# Patient Record
Sex: Female | Born: 1980 | Race: White | Hispanic: No | Marital: Single | Smoking: Current every day smoker
Health system: Southern US, Community
[De-identification: ages and names within clinical notes are randomized; demographics above are authoritative.]

## PROBLEM LIST (undated history)

## (undated) DIAGNOSIS — B1920 Unspecified viral hepatitis C without hepatic coma: Secondary | ICD-10-CM

## (undated) HISTORY — PX: HERNIA REPAIR: SHX51

---

## 2013-05-08 ENCOUNTER — Emergency Department (HOSPITAL_COMMUNITY)
Admission: EM | Admit: 2013-05-08 | Discharge: 2013-05-08 | Disposition: A | Discharge status: Home / Self Care | Payer: Self-pay | Attending: Emergency Medicine | Admitting: Emergency Medicine

## 2013-05-08 ENCOUNTER — Emergency Department (HOSPITAL_COMMUNITY): Payer: Self-pay

## 2013-05-08 ENCOUNTER — Encounter (HOSPITAL_COMMUNITY): Payer: Self-pay | Admitting: Emergency Medicine

## 2013-05-08 DIAGNOSIS — R630 Anorexia: Secondary | ICD-10-CM | POA: Insufficient documentation

## 2013-05-08 DIAGNOSIS — R197 Diarrhea, unspecified: Secondary | ICD-10-CM | POA: Insufficient documentation

## 2013-05-08 DIAGNOSIS — K5289 Other specified noninfective gastroenteritis and colitis: Secondary | ICD-10-CM | POA: Insufficient documentation

## 2013-05-08 DIAGNOSIS — N390 Urinary tract infection, site not specified: Secondary | ICD-10-CM

## 2013-05-08 DIAGNOSIS — Z3202 Encounter for pregnancy test, result negative: Secondary | ICD-10-CM | POA: Insufficient documentation

## 2013-05-08 DIAGNOSIS — K529 Noninfective gastroenteritis and colitis, unspecified: Secondary | ICD-10-CM

## 2013-05-08 DIAGNOSIS — F17200 Nicotine dependence, unspecified, uncomplicated: Secondary | ICD-10-CM | POA: Insufficient documentation

## 2013-05-08 DIAGNOSIS — R1110 Vomiting, unspecified: Secondary | ICD-10-CM | POA: Insufficient documentation

## 2013-05-08 DIAGNOSIS — B1920 Unspecified viral hepatitis C without hepatic coma: Secondary | ICD-10-CM | POA: Insufficient documentation

## 2013-05-08 HISTORY — DX: Unspecified viral hepatitis C without hepatic coma: B19.20

## 2013-05-08 LAB — COMPREHENSIVE METABOLIC PANEL
AST: 18 U/L (ref 0–37)
Albumin: 3.7 g/dL (ref 3.5–5.2)
Alkaline Phosphatase: 80 U/L (ref 39–117)
BUN: 13 mg/dL (ref 6–23)
Calcium: 9.3 mg/dL (ref 8.4–10.5)
Creatinine, Ser: 1.04 mg/dL (ref 0.50–1.10)
GFR calc Af Amer: 82 mL/min — ABNORMAL LOW (ref >90)
Glucose, Bld: 113 mg/dL — ABNORMAL HIGH (ref 70–99)
Total Bilirubin: 0.3 mg/dL (ref 0.3–1.2)
Total Protein: 7.1 g/dL (ref 6.0–8.3)

## 2013-05-08 LAB — URINE MICROSCOPIC-ADD ON

## 2013-05-08 LAB — CBC WITH DIFFERENTIAL/PLATELET
Basophils Relative: 1 % (ref 0–1)
Eosinophils Absolute: 0.2 10*3/uL (ref 0.0–0.7)
Eosinophils Relative: 3 % (ref 0–5)
HCT: 38.3 % (ref 36.0–46.0)
Hemoglobin: 12.9 g/dL (ref 12.0–15.0)
Lymphs Abs: 2.5 10*3/uL (ref 0.7–4.0)
MCH: 31.2 pg (ref 26.0–34.0)
MCHC: 33.7 g/dL (ref 30.0–36.0)
MCV: 92.5 fL (ref 78.0–100.0)
Monocytes Absolute: 0.5 10*3/uL (ref 0.1–1.0)
Monocytes Relative: 7 % (ref 3–12)
Neutro Abs: 3.6 10*3/uL (ref 1.7–7.7)
Neutrophils Relative %: 53 % (ref 43–77)
RBC: 4.14 MIL/uL (ref 3.87–5.11)
WBC: 6.8 10*3/uL (ref 4.0–10.5)

## 2013-05-08 LAB — POCT PREGNANCY, URINE: Preg Test, Ur: NEGATIVE

## 2013-05-08 LAB — URINALYSIS, ROUTINE W REFLEX MICROSCOPIC
Glucose, UA: NEGATIVE mg/dL
Ketones, ur: NEGATIVE mg/dL
Nitrite: POSITIVE — AB
Protein, ur: NEGATIVE mg/dL
Specific Gravity, Urine: 1.024 (ref 1.005–1.030)
pH: 7 (ref 5.0–8.0)

## 2013-05-08 MED ORDER — IOHEXOL 300 MG/ML  SOLN
80.0000 mL | Freq: Once | INTRAMUSCULAR | Status: AC | PRN
  Administered 2013-05-08: 80 mL via INTRAVENOUS

## 2013-05-08 MED ORDER — HYDROMORPHONE HCL PF 1 MG/ML IJ SOLN
1.0000 mg | Freq: Once | INTRAMUSCULAR | Status: AC
  Administered 2013-05-08: 1 mg via INTRAVENOUS
  Filled 2013-05-08: qty 1

## 2013-05-08 MED ORDER — DEXTROSE 5 % IV SOLN
1.0000 g | Freq: Once | INTRAVENOUS | Status: AC
  Administered 2013-05-08: 1 g via INTRAVENOUS
  Filled 2013-05-08: qty 10

## 2013-05-08 MED ORDER — ONDANSETRON HCL 4 MG PO TABS: 4.0000 mg | ORAL_TABLET | Freq: Four times a day (QID) | ORAL | Status: DC

## 2013-05-08 MED ORDER — IOHEXOL 300 MG/ML  SOLN
25.0000 mL | Freq: Once | INTRAMUSCULAR | Status: AC | PRN
  Administered 2013-05-08: 25 mL via ORAL

## 2013-05-08 MED ORDER — ONDANSETRON HCL 4 MG/2ML IJ SOLN
4.0000 mg | Freq: Once | INTRAMUSCULAR | Status: AC
  Administered 2013-05-08: 4 mg via INTRAVENOUS
  Filled 2013-05-08: qty 2

## 2013-05-08 MED ORDER — CIPROFLOXACIN HCL 250 MG PO TABS: 250.0000 mg | ORAL_TABLET | Freq: Two times a day (BID) | ORAL | Status: AC

## 2013-05-08 NOTE — ED Notes (Signed)
Pt reports n/v since last night. Only pain is menstrual cramps she states that's normal for her because shes on her period. 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

## 2013-05-08 NOTE — ED Notes (Signed)
The pt reports that her pain is better but her rlq is  The worst pain..  She is asking for oral fluid/  Not given.  Unsure id she needs further testing.

## 2013-05-08 NOTE — ED Notes (Signed)
Iv nss started

## 2013-05-08 NOTE — ED Notes (Signed)
The pt returned from c-t 

## 2013-05-08 NOTE — ED Notes (Signed)
Pain and nausea med given.  Pt asking for her rt ear to be checked by the doctor

## 2013-05-08 NOTE — ED Notes (Signed)
The pt has had nv and diarrhea since this am.  She also has had abd cramps since yesterday.  She is currently on her period.  No obvious distress at present skin warm and dry

## 2013-05-08 NOTE — ED Provider Notes (Signed)
CSN: [id number]     Arrival date & time 05/08/13  1527 History   First MD Initiated Contact with Patient 05/08/13 1629     Chief Complaint  Patient presents with  . Emesis   (Consider location/radiation/quality/duration/timing/severity/associated sxs/prior Treatment) HPI Comments: 32 y/o female with history of hepatitis C presenting with emesis and abdominal pain. RLQ pain began 3 days ago and is intermittent and aching. Emesis began last night with 6 episodes of nonbloody, nonbilious emesis. Associated with nonbloody diarrhea. No fevers, dysuria, vaginal discharge. Reports tubal ligation. Reports currently on menstrual cycle which began yesterday and has mild abdominal cramping.  The history is provided by the patient. No language interpreter was used.    Past Medical History  Diagnosis Date  . Hepatitis C    History reviewed. No pertinent past surgical history. History reviewed. No pertinent family history. History  Substance Use Topics  . Smoking status: Current Every Day Smoker    Types: Cigarettes  . Smokeless tobacco: Not on file  . Alcohol Use: Yes   OB History   Grav Para Term Preterm Abortions TAB SAB Ect Mult Living                 Review of Systems  Constitutional: Positive for appetite change. Negative for fever and activity change.  Respiratory: Negative for cough, chest tightness and shortness of breath.   Cardiovascular: Negative for chest pain.  Gastrointestinal: Positive for nausea, vomiting, abdominal pain and diarrhea. Negative for constipation, blood in stool and abdominal distention.  Genitourinary: Positive for vaginal bleeding. Negative for dysuria, frequency, hematuria, vaginal discharge and pelvic pain.  Musculoskeletal: Negative for back pain.  All other systems reviewed and are negative.    Allergies  Ultram and Z-pak  Home Medications   Current Outpatient Rx  Name  Route  Sig  Dispense  Refill  . acetaminophen (TYLENOL) 500 MG tablet  Oral   Take 500 mg by mouth every 6 (six) hours as needed for pain.          BP 109/68  Pulse 62  Temp(Src) 99.1 F (37.3 C) (Oral)  Resp 20  SpO2 99% Physical Exam  Vitals reviewed. Constitutional: She is oriented to person, place, and time. She appears well-developed and well-nourished. No distress.  HENT:  Head: Atraumatic.  Mouth/Throat: Oropharynx is clear and moist.  Eyes: Conjunctivae are normal.  Cardiovascular: Normal rate, regular rhythm, normal heart sounds and intact distal pulses.   Pulmonary/Chest: Effort normal and breath sounds normal.  Abdominal: Soft. Bowel sounds are normal. There is no hepatosplenomegaly. There is tenderness in the right lower quadrant. There is no rigidity, no rebound, no guarding and no CVA tenderness. No hernia. Hernia confirmed negative in the right inguinal area and confirmed negative in the left inguinal area.  No peritonitis. Negative Rovsing's   Genitourinary: Uterus normal. There is no rash or lesion on the right labia. There is no rash or lesion on the left labia. Cervix exhibits no motion tenderness. Right adnexum displays no mass, no tenderness and no fullness. Left adnexum displays no mass, no tenderness and no fullness.  Lymphadenopathy:       Right: No inguinal adenopathy present.       Left: No inguinal adenopathy present.  Neurological: She is alert and oriented to person, place, and time.  Skin: Skin is warm and dry.  Psychiatric: She has a normal mood and affect. Her behavior is normal. Judgment and thought content normal.    ED Course  Procedures (including critical care time) Labs Review Labs Reviewed  COMPREHENSIVE METABOLIC PANEL - Abnormal; Notable for the following:    Glucose, Bld 113 (*)    ALT 46 (*)    GFR calc non Af Amer 71 (*)    GFR calc Af Amer 82 (*)    All other components within normal limits  URINALYSIS, ROUTINE W REFLEX MICROSCOPIC - Abnormal; Notable for the following:    APPearance CLOUDY (*)     Hgb urine dipstick TRACE (*)    Nitrite POSITIVE (*)    Leukocytes, UA SMALL (*)    All other components within normal limits  URINE MICROSCOPIC-ADD ON - Abnormal; Notable for the following:    Squamous Epithelial / LPF FEW (*)    Bacteria, UA MANY (*)    All other components within normal limits  URINE CULTURE  CBC WITH DIFFERENTIAL  POCT PREGNANCY, URINE   Imaging Review Ct Abdomen Pelvis W Contrast  05/08/2013   CLINICAL DATA:  32 year old female with right abdominal and pelvic pain, diarrhea and vomiting.  EXAM: CT ABDOMEN AND PELVIS WITH CONTRAST  TECHNIQUE: Multidetector CT imaging of the abdomen and pelvis was performed using the standard protocol following bolus administration of intravenous contrast.  CONTRAST:  80mL OMNIPAQUE IOHEXOL 300 MG/ML  SOLN  COMPARISON:  None  FINDINGS: The liver, spleen, kidneys, adrenal glands, gallbladder and pancreas are unremarkable.  Duodenal and proximal jejunum wall thickening noted - question enteritis.  There is no evidence of bowel obstruction, pneumoperitoneum or abscess.  The appendix and terminal ileum are unremarkable.  There is no evidence of free fluid, enlarged lymph nodes, biliary dilation or abdominal aortic aneurysm.  The uterus and adnexal regions are within normal limits.  No acute or suspicious bony abnormalities are identified.  IMPRESSION: Nonspecific duodenal and proximal jejunal wall thickening - most likely representing an enteritis. Clinical followup recommended.   Electronically Signed   By: Laveda Abbe M.D.   On: 05/08/2013 19:15    EKG Interpretation   None       MDM   1. Enteritis   2. UTI (lower urinary tract infection)     32 y/o female with RLQ abdominal pain and NBNB emesis. Associated with nonbloody diarrhea. No fevers. Unable to tolerate PO for 1 day. Pain began 3 days ago. AFVSS. Mild RLQ tenderness on exam. No peritonitis. Bimanual exam unremarkable, no adnexal tenderness, no CMT. Doubt TOA, PID, torsion.  UA  with  Nitrites, leukocytes, and bacteria. UPT negative. CT obtained to r/o appendicitis and showing normal appendix and enteritis. Will treat for UTI with Ciprofloxacin. Zofran given for nausea. Patient tolerating PO intake. Appropriate for discharge with PCP f/u. Return precautions discussed and patient voiced understanding of instructions.   Labs and imaging reviewed in my medical decision making if ordered. Patient discussed with my attending, Dr. Bluford Kaufmann, MD 05/09/13 [phone]

## 2013-05-08 NOTE — ED Notes (Signed)
Oral contrast  Given.   1 cup to drink

## 2013-05-08 NOTE — ED Provider Notes (Signed)
I have supervised the resident on the management of this patient and agree with the note above. I personally interviewed and examined the patient and my addendum is below.   Rebecca Neal is a 32 y.o. female hx of hepatitis C here with RLQ pain. Pain for the last 3 days, + vomiting since yesterday. She is on her menstrual cycle currently. Mild tenderness RLQ. Nontender on the R adnexa. Will do CT ab/pel to r/o appy.   CT showed enteritis. She also has UTI, given cipro. Stable for d/c.    Richardean Canal, MD 05/09/13 [phone]

## 2013-05-11 LAB — URINE CULTURE: Colony Count: >=100000

## 2013-05-12 NOTE — Progress Notes (Signed)
ED Antimicrobial Stewardship Positive Culture Follow Up   Rebecca Neal is an 32 y.o. female who presented to [facility] on 05/08/2013 with a chief complaint of  Chief Complaint  Patient presents with  . Emesis    Recent Results (from the past 720 hour(s))  URINE CULTURE     Status: None   Collection Time    05/08/13  4:31 PM      Result Value Range Status   Specimen Description URINE, RANDOM   Final   Special Requests NONE   Final   Culture  Setup Time     Final   Value: 05/09/2013 02:01     Performed at [facility] Count     Final   Value: >=100,000 COLONIES/ML     Performed at [facility]   Culture     Final   Value: ESCHERICHIA COLI     Performed at [facility]   Report Status 05/11/2013 FINAL   Final   Organism ID, Bacteria ESCHERICHIA COLI   Final    [x]  Treated with Ciprofloxacin, organism resistant to prescribed antimicrobial []  Patient discharged originally without antimicrobial agent and treatment is now indicated  New antibiotic prescription: Bactrim DS 1 tablet PO BID x 5 days  ED Provider: Ebbie Ridge, PA-C   Cleon Dew 05/12/2013, 4:11 PM Infectious Diseases Pharmacist Phone# [phone]

## 2013-05-12 NOTE — ED Notes (Signed)
Post ED Visit - Positive Culture Follow-up: Successful Patient Follow-Up  Culture assessed and recommendations reviewed by: [x]  Wes Dulaney, Pharm.D., BCPS []  Celedonio Miyamoto, Pharm.D., BCPS []  Georgina Pillion, Pharm.D., BCPS []  Lenora, [address].D., BCPS, AAHIVP []  Estella Husk, Pharm.D., BCPS, AAHIVP  Positive urine culture  []  Patient discharged without antimicrobial prescription and treatment is now indicated [x]  Organism is resistant to prescribed ED discharge antimicrobial []  Patient with positive blood cultures  Changes discussed with ED provider: Ebbie Ridge New antibiotic prescription Stop Cipro start Bactrim DS 1 tablet po BID x 5 days   Patient informed of positive results   Larena Sox 05/12/2013, 4:24 PM

## 2014-01-30 ENCOUNTER — Encounter (HOSPITAL_COMMUNITY): Payer: Self-pay | Admitting: *Deleted

## 2014-01-30 ENCOUNTER — Inpatient Hospital Stay (HOSPITAL_COMMUNITY)
Admission: AD | Admit: 2014-01-30 | Discharge: 2014-02-04 | DRG: 885 | Disposition: A | Discharge status: Home / Self Care | Payer: No Typology Code available for payment source | Source: Intra-hospital | Attending: Psychiatry | Admitting: Psychiatry

## 2014-01-30 ENCOUNTER — Emergency Department (HOSPITAL_COMMUNITY)
Admission: EM | Admit: 2014-01-30 | Discharge: 2014-01-30 | Disposition: A | Discharge status: Other Acute Inpatient Hospital | Payer: Self-pay | Attending: Emergency Medicine | Admitting: Emergency Medicine

## 2014-01-30 ENCOUNTER — Encounter (HOSPITAL_COMMUNITY): Payer: Self-pay | Admitting: Emergency Medicine

## 2014-01-30 DIAGNOSIS — F17200 Nicotine dependence, unspecified, uncomplicated: Secondary | ICD-10-CM | POA: Diagnosis present

## 2014-01-30 DIAGNOSIS — F332 Major depressive disorder, recurrent severe without psychotic features: Principal | ICD-10-CM | POA: Diagnosis present

## 2014-01-30 DIAGNOSIS — F1910 Other psychoactive substance abuse, uncomplicated: Secondary | ICD-10-CM

## 2014-01-30 DIAGNOSIS — F329 Major depressive disorder, single episode, unspecified: Secondary | ICD-10-CM | POA: Diagnosis present

## 2014-01-30 DIAGNOSIS — Z3202 Encounter for pregnancy test, result negative: Secondary | ICD-10-CM | POA: Insufficient documentation

## 2014-01-30 DIAGNOSIS — Z598 Other problems related to housing and economic circumstances: Secondary | ICD-10-CM

## 2014-01-30 DIAGNOSIS — F4310 Post-traumatic stress disorder, unspecified: Secondary | ICD-10-CM | POA: Diagnosis present

## 2014-01-30 DIAGNOSIS — F411 Generalized anxiety disorder: Secondary | ICD-10-CM | POA: Insufficient documentation

## 2014-01-30 DIAGNOSIS — F3289 Other specified depressive episodes: Secondary | ICD-10-CM | POA: Insufficient documentation

## 2014-01-30 DIAGNOSIS — F1110 Opioid abuse, uncomplicated: Secondary | ICD-10-CM | POA: Insufficient documentation

## 2014-01-30 DIAGNOSIS — B1920 Unspecified viral hepatitis C without hepatic coma: Secondary | ICD-10-CM | POA: Diagnosis present

## 2014-01-30 DIAGNOSIS — F603 Borderline personality disorder: Secondary | ICD-10-CM | POA: Diagnosis present

## 2014-01-30 DIAGNOSIS — N390 Urinary tract infection, site not specified: Secondary | ICD-10-CM | POA: Insufficient documentation

## 2014-01-30 DIAGNOSIS — Z5987 Material hardship due to limited financial resources, not elsewhere classified: Secondary | ICD-10-CM

## 2014-01-30 DIAGNOSIS — F39 Unspecified mood [affective] disorder: Secondary | ICD-10-CM | POA: Diagnosis present

## 2014-01-30 DIAGNOSIS — F1220 Cannabis dependence, uncomplicated: Secondary | ICD-10-CM | POA: Insufficient documentation

## 2014-01-30 DIAGNOSIS — F1210 Cannabis abuse, uncomplicated: Secondary | ICD-10-CM | POA: Diagnosis present

## 2014-01-30 DIAGNOSIS — F333 Major depressive disorder, recurrent, severe with psychotic symptoms: Secondary | ICD-10-CM

## 2014-01-30 DIAGNOSIS — G4700 Insomnia, unspecified: Secondary | ICD-10-CM | POA: Diagnosis present

## 2014-01-30 DIAGNOSIS — R45851 Suicidal ideations: Secondary | ICD-10-CM

## 2014-01-30 DIAGNOSIS — Z8619 Personal history of other infectious and parasitic diseases: Secondary | ICD-10-CM | POA: Insufficient documentation

## 2014-01-30 LAB — COMPREHENSIVE METABOLIC PANEL
ALT: 141 U/L — ABNORMAL HIGH (ref 0–35)
AST: 127 U/L — ABNORMAL HIGH (ref 0–37)
Albumin: 3.9 g/dL (ref 3.5–5.2)
Alkaline Phosphatase: 128 U/L — ABNORMAL HIGH (ref 39–117)
Anion gap: 12 (ref 5–15)
BUN: 9 mg/dL (ref 6–23)
CALCIUM: 9.1 mg/dL (ref 8.4–10.5)
CO2: 24 mEq/L (ref 19–32)
Chloride: 104 mEq/L (ref 96–112)
Creatinine, Ser: 0.55 mg/dL (ref 0.50–1.10)
GLUCOSE: 112 mg/dL — AB (ref 70–99)
Potassium: 4 mEq/L (ref 3.7–5.3)
Sodium: 140 mEq/L (ref 137–147)
Total Bilirubin: 0.3 mg/dL (ref 0.3–1.2)
Total Protein: 7.1 g/dL (ref 6.0–8.3)

## 2014-01-30 LAB — URINALYSIS, ROUTINE W REFLEX MICROSCOPIC
Bilirubin Urine: NEGATIVE
Glucose, UA: NEGATIVE mg/dL
Ketones, ur: NEGATIVE mg/dL
Nitrite: POSITIVE — AB
Protein, ur: NEGATIVE mg/dL
Specific Gravity, Urine: 1.013 (ref 1.005–1.030)
Urobilinogen, UA: 1 mg/dL (ref 0.0–1.0)
pH: 7.5 (ref 5.0–8.0)

## 2014-01-30 LAB — CBC WITH DIFFERENTIAL/PLATELET
Basophils Absolute: 0 10*3/uL (ref 0.0–0.1)
Basophils Relative: 1 % (ref 0–1)
EOS PCT: 2 % (ref 0–5)
Eosinophils Absolute: 0.1 10*3/uL (ref 0.0–0.7)
HEMATOCRIT: 38.7 % (ref 36.0–46.0)
HEMOGLOBIN: 13 g/dL (ref 12.0–15.0)
Lymphocytes Relative: 45 % (ref 12–46)
Lymphs Abs: 2 10*3/uL (ref 0.7–4.0)
MCH: 30.5 pg (ref 26.0–34.0)
MCHC: 33.6 g/dL (ref 30.0–36.0)
MCV: 90.8 fL (ref 78.0–100.0)
MONO ABS: 0.3 10*3/uL (ref 0.1–1.0)
MONOS PCT: 7 % (ref 3–12)
Neutro Abs: 2 10*3/uL (ref 1.7–7.7)
Neutrophils Relative %: 45 % (ref 43–77)
Platelets: 193 10*3/uL (ref 150–400)
RBC: 4.26 MIL/uL (ref 3.87–5.11)
RDW: 13.1 % (ref 11.5–15.5)
WBC: 4.5 10*3/uL (ref 4.0–10.5)

## 2014-01-30 LAB — RAPID URINE DRUG SCREEN, HOSP PERFORMED
Amphetamines: NOT DETECTED
Barbiturates: NOT DETECTED
Benzodiazepines: NOT DETECTED
COCAINE: NOT DETECTED
Opiates: POSITIVE — AB
Tetrahydrocannabinol: POSITIVE — AB

## 2014-01-30 LAB — URINE MICROSCOPIC-ADD ON

## 2014-01-30 LAB — SALICYLATE LEVEL: Salicylate Lvl: <2 mg/dL — ABNORMAL LOW (ref 2.8–20.0)

## 2014-01-30 LAB — ETHANOL: Alcohol, Ethyl (B): <11 mg/dL (ref 0–11)

## 2014-01-30 LAB — PREGNANCY, URINE: Preg Test, Ur: NEGATIVE

## 2014-01-30 LAB — ACETAMINOPHEN LEVEL: Acetaminophen (Tylenol), Serum: <15 ug/mL (ref 10–30)

## 2014-01-30 MED ORDER — NICOTINE 21 MG/24HR TD PT24
21.0000 mg | MEDICATED_PATCH | Freq: Every day | TRANSDERMAL | Status: DC
  Administered 2014-01-30 – 2014-02-03 (×5): 21 mg via TRANSDERMAL
  Filled 2014-01-30: qty 1
  Filled 2014-01-30: qty 14
  Filled 2014-01-30 (×5): qty 1

## 2014-01-30 MED ORDER — MAGNESIUM HYDROXIDE 400 MG/5ML PO SUSP: 30.0000 mL | Freq: Every day | ORAL | Status: DC | PRN

## 2014-01-30 MED ORDER — ALUM & MAG HYDROXIDE-SIMETH 200-200-20 MG/5ML PO SUSP: 30.0000 mL | ORAL | Status: DC | PRN

## 2014-01-30 MED ORDER — IBUPROFEN 400 MG PO TABS: 600.0000 mg | ORAL_TABLET | Freq: Three times a day (TID) | ORAL | Status: DC | PRN

## 2014-01-30 MED ORDER — NICOTINE 21 MG/24HR TD PT24: 21.0000 mg | MEDICATED_PATCH | Freq: Every day | TRANSDERMAL | Status: DC

## 2014-01-30 MED ORDER — ONDANSETRON HCL 4 MG PO TABS: 4.0000 mg | ORAL_TABLET | Freq: Three times a day (TID) | ORAL | Status: DC | PRN

## 2014-01-30 MED ORDER — ACETAMINOPHEN 325 MG PO TABS: 650.0000 mg | ORAL_TABLET | ORAL | Status: DC | PRN

## 2014-01-30 MED ORDER — TRAZODONE HCL 50 MG PO TABS
50.0000 mg | ORAL_TABLET | Freq: Every evening | ORAL | Status: DC | PRN
  Administered 2014-02-02: 50 mg via ORAL
  Filled 2014-01-30: qty 1

## 2014-01-30 MED ORDER — CEPHALEXIN 250 MG PO CAPS
1000.0000 mg | ORAL_CAPSULE | Freq: Once | ORAL | Status: AC
  Administered 2014-01-30: 1000 mg via ORAL
  Filled 2014-01-30: qty 4

## 2014-01-30 MED ORDER — ZOLPIDEM TARTRATE 5 MG PO TABS: 5.0000 mg | ORAL_TABLET | Freq: Every evening | ORAL | Status: DC | PRN

## 2014-01-30 MED ORDER — NICOTINE 21 MG/24HR TD PT24
MEDICATED_PATCH | TRANSDERMAL | Status: AC
Start: 2014-01-30 — End: 2014-01-31
  Filled 2014-01-30: qty 1

## 2014-01-30 MED ORDER — ACETAMINOPHEN 325 MG PO TABS
650.0000 mg | ORAL_TABLET | Freq: Four times a day (QID) | ORAL | Status: DC | PRN
  Administered 2014-02-02: 650 mg via ORAL
  Filled 2014-01-30: qty 2

## 2014-01-30 MED ORDER — HYDROXYZINE HCL 50 MG PO TABS
50.0000 mg | ORAL_TABLET | Freq: Four times a day (QID) | ORAL | Status: DC | PRN
  Administered 2014-01-30 – 2014-02-04 (×4): 50 mg via ORAL
  Filled 2014-01-30 (×4): qty 1

## 2014-01-30 NOTE — ED Provider Notes (Signed)
CSN: [id number]     Arrival date & time 01/30/14  0628 History   First MD Initiated Contact with Patient 01/30/14 [phone]     Chief Complaint  Patient presents with  . Suicidal  . Depression     (Consider location/radiation/quality/duration/timing/severity/associated sxs/prior Treatment) HPI Patient presents to the emergency department with suicidal ideation, and increased depression.  The patient, states, that she's attempted suicide in the past by running out in front of a car and taking pills.  The patient, states, that she does not have any nausea, vomiting, headache, blurred vision, weakness, dizziness, back pain, neck pain, chest pain, shortness of breath, or syncope.  The patient, states, that she does hear voices, but she does not have any harmful.  Voices Past Medical History  Diagnosis Date  . Hepatitis C    History reviewed. No pertinent past surgical history. No family history on file. History  Substance Use Topics  . Smoking status: Current Every Day Smoker    Types: Cigarettes  . Smokeless tobacco: Not on file  . Alcohol Use: Yes   OB History   Grav Para Term Preterm Abortions TAB SAB Ect Mult Living                 Review of Systems All other systems negative except as documented in the HPI. All pertinent positives and negatives as reviewed in the HPI.   Allergies  Ultram and Z-pak  Home Medications   Prior to Admission medications   Not on File   BP 125/81  Pulse 84  Temp(Src) 97.8 F (36.6 C) (Oral)  Resp 20  Ht 5\' 3"  (1.6 m)  Wt 120 lb (54.432 kg)  BMI 21.26 kg/m2  SpO2 98% Physical Exam  Nursing note and vitals reviewed. Constitutional: She is oriented to person, place, and time. She appears well-developed and well-nourished. No distress.  HENT:  Head: Normocephalic and atraumatic.  Mouth/Throat: Oropharynx is clear and moist.  Eyes: Pupils are equal, round, and reactive to light.  Neck: Normal range of motion. Neck supple.  Cardiovascular:  Normal rate, regular rhythm and normal heart sounds.  Exam reveals no gallop and no friction rub.   No murmur heard. Pulmonary/Chest: Effort normal and breath sounds normal. No respiratory distress.  Neurological: She is alert and oriented to person, place, and time. She exhibits normal muscle tone. Coordination normal.  Skin: Skin is warm and dry. No rash noted. No erythema.  Psychiatric: Her mood appears anxious. She exhibits a depressed mood. She expresses suicidal ideation.    ED Course  Procedures (including critical care time) Labs Review Labs Reviewed  COMPREHENSIVE METABOLIC PANEL - Abnormal; Notable for the following:    Glucose, Bld 112 (*)    AST 127 (*)    ALT 141 (*)    Alkaline Phosphatase 128 (*)    All other components within normal limits  SALICYLATE LEVEL - Abnormal; Notable for the following:    Salicylate Lvl <2.0 (*)    All other components within normal limits  CBC WITH DIFFERENTIAL  ETHANOL  ACETAMINOPHEN LEVEL  URINE RAPID DRUG SCREEN (HOSP PERFORMED)  PREGNANCY, URINE      Patient will be evaluated by psych  Carlyle Dollyhristopher W Tanja Gift, PA-C 02/01/14 1600

## 2014-01-30 NOTE — ED Notes (Signed)
Lunch tray ordered for pt.
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
## 2014-01-30 NOTE — Tx Team (Signed)
Initial Interdisciplinary Treatment Plan  PATIENT STRENGTHS: (choose at least two) Ability for insight  PATIENT STRESSORS: Substance abuse   PROBLEM LIST: Problem List/Patient Goals Date to be addressed Date deferred Reason deferred Estimated date of resolution  Depression 01/30/14                                                      DISCHARGE CRITERIA:  Improved stabilization in mood, thinking, and/or behavior  PRELIMINARY DISCHARGE PLAN: Return to previous living arrangement  PATIENT/FAMIILY INVOLVEMENT: This treatment plan has been presented to and reviewed with the patient, Jule EconomyJennifer Tremaine, and/or family member.  The patient and family have been given the opportunity to ask questions and make suggestions.  Jacquelyne BalintForrest, Manasseh Pittsley Shanta 01/30/2014, 1:04 PM
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
## 2014-01-30 NOTE — ED Notes (Signed)
[facility] HAS ARRIVED TO TRANSPORT PT 

## 2014-01-30 NOTE — Progress Notes (Signed)
D: Pt mood is depressed. She rates her depression a 10/10. Denies SI and verbally contracts for safety. She was tearful in group and left early. Pt guarded and minimally interacts.  A: Support given. Verbalization encouraged. Medications given as prescribed. Pt encouraged to come to staff with any concerns.  R: Pt is receptive. No complaints of pain or discomfort at this time. Q15 min safety checks maintained. Will continue to monitor pt.
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
## 2014-01-30 NOTE — ED Notes (Signed)
Pt wanded by security.  Pt belongings inventoried

## 2014-01-30 NOTE — ED Provider Notes (Signed)
Medical screening examination/treatment/procedure(s) were conducted as a shared visit with non-physician practitioner(s) and myself.  I personally evaluated the patient during the encounter.   EKG Interpretation None     SI without HI or hallucinations; is voluntary  [facility] and [facility] agrees.  Start keflex for possible UTI. Recommend 3 days Keflex 1000mg  bid. 1150  [name], MD 01/30/14 1159

## 2014-01-30 NOTE — Progress Notes (Signed)
Patient ID: Rebecca EconomyJennifer Neal, female   DOB: 09/24/1980, 33 y.o.   MRN: [id number]  33 year  old white female admitted [facility] depression with SI with no plan. Pt reported that she was depressed, but could not identify why she was depressed. Pt reported that your stress as been the same, she just reached a point where she could not take life anymore. Pt reported at time of admission she did not abuse any chemicals, drugs, or substances however did smoke THC recently. Pt reported being negative SI/HI, no AH/VH noted.

## 2014-01-30 NOTE — [facility]HH Group Notes (Signed)
Adult Psychoeducational Group Note  Date:  01/30/2014 Time:  11:28 PM  Group Topic/Focus:  Wrap-Up Group:   The focus of this group is to help patients review their daily goal of treatment and discuss progress on daily workbooks.  Participation Level:  Minimal  Participation Quality:  Inattentive, Resistant and Sharing  Affect:  Depressed, Flat and Lethargic  Cognitive:  Appropriate  Insight: Limited  Engagement in Group:  Poor  Modes of Intervention:  Support  Additional Comments:  Pt attended wrap up group this evening.  She was able to sit and listen to those in group.  When it was her turn to participate she was able to state her name, started to cry and left group.  Underwriter check on pt after group in bed, she stated she was fine.  Aundria RudWILKINSON, Talon Regala L 01/30/2014, 11:28 PM

## 2014-01-30 NOTE — BH Assessment (Signed)
[facility] Assessment Progress Note  Called EDP Bednar @ 1003 to get clinical on the pt, as a tele assessment ordered for the pt.  Called pt's nurse, Crystal, and tele assessment to be completed once she can set up the equipment.  Appt scheduled for 1010.  Casimer LaniusKristen Trenell Moxey, MS, [facility] Licensed Professional Counselor Triage Specialist

## 2014-01-30 NOTE — ED Notes (Signed)
[facility] PATIENT HAS BEEN ACCEPTED TO [facility].

## 2014-01-31 ENCOUNTER — Encounter (HOSPITAL_COMMUNITY): Payer: Self-pay | Admitting: Psychiatry

## 2014-01-31 DIAGNOSIS — F39 Unspecified mood [affective] disorder: Secondary | ICD-10-CM | POA: Diagnosis present

## 2014-01-31 DIAGNOSIS — F4310 Post-traumatic stress disorder, unspecified: Secondary | ICD-10-CM | POA: Diagnosis present

## 2014-01-31 MED ORDER — ARIPIPRAZOLE 2 MG PO TABS
2.0000 mg | ORAL_TABLET | Freq: Every day | ORAL | Status: DC
  Administered 2014-02-01 – 2014-02-04 (×4): 2 mg via ORAL
  Filled 2014-01-31 (×4): qty 1
  Filled 2014-01-31: qty 14
  Filled 2014-01-31 (×2): qty 1

## 2014-01-31 MED ORDER — ARIPIPRAZOLE 2 MG PO TABS
2.0000 mg | ORAL_TABLET | Freq: Once | ORAL | Status: AC
  Administered 2014-01-31: 2 mg via ORAL
  Filled 2014-01-31 (×2): qty 1

## 2014-01-31 NOTE — Progress Notes (Signed)
D: Pt mood is depressed. She has spent most of this shift in the bed. Did not attend group tonight. States that she feels better now after being upset earlier today. A: Support given. Verbalization encouraged. Medications given as prescribed. Pt encouraged to come to staff with any concerns.  R: Pt is receptive. No complaints of pain or discomfort at this time. Denies SI/HI/AVH. Q15 min safety checks maintained. Will continue to monitor pt.

## 2014-01-31 NOTE — [facility] Group Notes (Signed)
[facility] Group Notes: (Clinical Social Work)   01/31/2014      Type of Therapy:  Group Therapy   Participation Level:  Did Not Attend    Ambrose MantleMareida Grossman-Orr, LCSW 01/31/2014, 4:18 PM

## 2014-01-31 NOTE — Progress Notes (Signed)
Patient ID: Rebecca EconomyJennifer Neal, female   DOB: 03/23/1981, 33 y.o.   MRN: [id number] D)  Has been resting quietly tonight, eyes closed, resp reg, unlabored, no c/o's voiced. A)  Will continue to monitor for safety, continue POC R) Safety maintained.

## 2014-01-31 NOTE — Progress Notes (Signed)
Psychoeducational Group Note  Date:  01/31/2014 Time:  1015  Group Topic/Focus:  Making Healthy Choices:   The focus of this group is to help patients identify negative/unhealthy choices they were using prior to admission and identify positive/healthier coping strategies to replace them upon discharge.  Participation Level:  Active  Participation Quality:  Appropriate  Affect:  Appropriate  Cognitive:  Oriented  Insight:  Improving  Engagement in Group:  Engaged  Additional Comments:    Rebecca Neal A 01/31/2014 

## 2014-01-31 NOTE — [facility] Suicide Risk Assessment (Signed)
Suicide Risk Assessment  Admission Assessment     Nursing information obtained from:  Patient Demographic factors:  Caucasian Current Mental Status:  Suicidal ideation indicated by patient Loss Factors:  Loss of significant relationship Historical Factors:  Prior suicide attempts Risk Reduction Factors:  Responsible for children under 33 years of age Total Time spent with patient: 45 minutes  CLINICAL FACTORS:   Panic Attacks Depression:   Severe  Psychiatric Specialty Exam:     Blood pressure 110/86, pulse 84, temperature 97 F (36.1 C), temperature source Oral, resp. rate 18, height 5\' 3"  (1.6 m), weight 54.432 kg (120 lb), last menstrual period 01/30/2014.Body mass index is 21.26 kg/(m^2).  General Appearance: Fairly Groomed  Patent attorneyye Contact::  Fair  Speech:  Clear and Coherent  Volume:  Decreased  Mood:  Anxious, Depressed and Hopeless  Affect:  Depressed, Tearful and anxious, worried  Thought Process:  Coherent and Goal Directed  Orientation:  Full (Time, Place, and Person)  Thought Content:  symptoms, worries, concerns  Suicidal Thoughts:  Yes.  without intent/plan  Homicidal Thoughts:  No  Memory:  Immediate;   Fair Recent;   Fair Remote;   Fair  Judgement:  Fair  Insight:  Present  Psychomotor Activity:  Restlessness  Concentration:  Fair  Recall:  FiservFair  Fund of Knowledge:NA  Language: Fair  Akathisia:  No  Handed:    AIMS (if indicated):     Assets:  Desire for Improvement Social Support  Sleep:      Musculoskeletal: Strength & Muscle Tone: within normal limits Gait & Station: normal Patient leans: N/A  COGNITIVE FEATURES THAT CONTRIBUTE TO RISK:  Closed-mindedness Polarized thinking Thought constriction (tunnel vision)    SUICIDE RISK:   Moderate:  Frequent suicidal ideation with limited intensity, and duration, some specificity in terms of plans, no associated intent, good self-control, limited dysphoria/symptomatology, some risk factors present, and  identifiable protective factors, including available and accessible social support.  PLAN OF CARE:  Supportive approach/coping skills/relapse prevention                               Evaluate further                               CBT;mindfulness  I certify that inpatient services furnished can reasonably be expected to improve the patient's condition.  Henslee Lottman A 01/31/2014, 4:37 PM

## 2014-01-31 NOTE — Progress Notes (Signed)
Adult Psychoeducational Group Note  Date:  01/31/2014 Time:  9:58 PM  Group Topic/Focus:  Wrap-Up Group:   The focus of this group is to help patients review their daily goal of treatment and discuss progress on daily workbooks.  Participation Level:  Active  Participation Quality:  Appropriate  Affect:  Appropriate  Cognitive:  Appropriate  Insight: Good  Engagement in Group:  Engaged  Modes of Intervention:  Clarification, Discussion and Exploration  Additional Comments:    Lorin MercyReives, Jamorris Ndiaye O 01/31/2014, 9:58 PM
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
## 2014-01-31 NOTE — Progress Notes (Signed)
Psychoeducational Group Note  Date: 01/31/2014 Time:  0930  Group Topic/Focus:  Gratefulness:  The focus of this group is to help patients identify what two things they are most grateful for in their lives. What helps ground them and to center them on their work to their recovery.  Participation Level:  Active  Participation Quality:  Appropriate  Affect:  Appropriate  Cognitive:  Oriented  Insight:  Improving  Engagement in Group:  Engaged  Additional Comments:    Rebecca Neal  

## 2014-02-01 DIAGNOSIS — F323 Major depressive disorder, single episode, severe with psychotic features: Secondary | ICD-10-CM

## 2014-02-01 MED ORDER — FLUOXETINE HCL 20 MG PO CAPS
20.0000 mg | ORAL_CAPSULE | Freq: Every day | ORAL | Status: DC
  Administered 2014-02-01 – 2014-02-04 (×4): 20 mg via ORAL
  Filled 2014-02-01 (×3): qty 1
  Filled 2014-02-01: qty 14
  Filled 2014-02-01 (×2): qty 1
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
## 2014-02-01 NOTE — [facility] Group Notes (Signed)
[facility]   02/01/2014 1:21 PM    Participation Quality:  Appropraite  Mood/Affect:  Appropriate  Depression Rating:  9  Anxiety Rating:  9  Thoughts of Suicide:  No  Will you contract for safety?   NA  Current AVH:  No  Plan for Discharge/Comments:  Patient attended discharge planning group and actively participated in group.  She advised of needing referral for outpatient servcies.CSW provided all participants with daily workbook.   Transportation Means: Patient has transportation.   Supports:  Patient has a support system.   Odena Mcquaid, Joesph JulyQuylle Hairston

## 2014-02-01 NOTE — Progress Notes (Signed)
Patient ID: Rebecca EconomyJennifer Wavra, female   DOB: 05/13/1981, 33 y.o.   MRN: [id number] D: Took over patient's care @ 2330. Patient in bed sleeping. Respiration regular and unlabored. No sign of distress noted at this time A: 15 mins checks for safety. R: Patient is safe.

## 2014-02-01 NOTE — Progress Notes (Signed)
Pt presents with flat affect and depressed mood. Pt appears to be minimizing her symptoms this morning, pt forwards little information, cautious and guarded. Pt reports depression 9/10 and hopeless 9/10. Pt reports depression is due to her wanting to be at home with her children. Pt denies SI/HI/AVH. Medications administered as ordered per MD. Verbal support given. Pt encouraged to attend groups. Pt encouraged to verbalize feelings/symptoms/ stressors.15 minute checks performed for safety. Pt safety maintained at this time.
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
## 2014-02-01 NOTE — Progress Notes (Signed)
Adult Psychoeducational Group Note  Date:  02/01/2014 Time:  10:11 PM  Group Topic/Focus:  Wrap-Up Group:   The focus of this group is to help patients review their daily goal of treatment and discuss progress on daily workbooks.  Participation Level:  Active  Participation Quality:  Appropriate  Affect:  Appropriate  Cognitive:  Appropriate  Insight: Appropriate  Engagement in Group:  Engaged  Modes of Intervention:  Support  Additional Comments:  Pt stated that positive thing that happened was that she started on her meds today and that her goal is to try and make it to all of her goals tomorrow and that today she only missed one group which she slept in on.   Jeannene Tschetter 02/01/2014, 10:11 PM

## 2014-02-01 NOTE — [facility] Group Notes (Signed)
[facility] LCSW Group Therapy          Overcoming Obstacles       1:15 -2:30        02/01/2014       Type of Therapy:  Group Therapy  Participation Level:  Appropriate  Participation Quality:  Appropriate  Affect:  Appropriate, Alert  Cognitive:  Attentive Appropriate  Insight: Developing/Improving Engaged  Engagement in Therapy: Developing/Imprvoing Engaged  Modes of Intervention:  Discussion Exploration  Education Rapport BuildingProblem-Solving Support  Summary of Progress/Problems:  The main focus of today's group was overcoming obstacles. She advised the obstacle she has to overcome is low self-esteem.   Patient able to identify appropriate coping skills.   [name], [name] 02/01/2014

## 2014-02-01 NOTE — Progress Notes (Signed)
D: Pt presents with a flat affect and depressed mood. Pt's affect brightens with interaction. Pt does verbalize that she is currently missing her kids. Pt verbalizes awareness of the initiation of her Prozac and Abilify. Pt is actively participating within the milieu. Pt approprietly interacts with staff and the other patients. Pt is currently denying any SI/HI with no reports of any AVH.  A: Writer administered scheduled medications to pt. Continued support and availability as needed was extended to this pt. Staff continue to monitor pt with q6215min checks.  R: No adverse drug reactions noted. Pt receptive to treatment. Pt remains safe at this time.

## 2014-02-02 MED ORDER — TRAZODONE HCL 100 MG PO TABS
100.0000 mg | ORAL_TABLET | Freq: Every evening | ORAL | Status: DC | PRN
  Administered 2014-02-02 – 2014-02-03 (×2): 100 mg via ORAL
  Filled 2014-02-02: qty 1
  Filled 2014-02-02: qty 14
  Filled 2014-02-02: qty 1

## 2014-02-02 NOTE — Progress Notes (Signed)
D: Pt denies SI/HI/AVH. Pt is pleasant and cooperative. Pt stated felt good, plans to go to family services and out patient services when shw leaves [facility].   A: Pt was offered support and encouragement. Pt was given scheduled medications. Pt was encourage to attend groups. Q 15 minute checks were done for safety.   R:Pt attends groups and interacts well with peers and staff. Pt is taking medication. Pt has no complaints at this time.Pt receptive to treatment and safety maintained on unit.

## 2014-02-02 NOTE — [facility] Group Notes (Signed)
[facility] LCSW Group Therapy      Feelings About Diagnosis 1:15 - 2:30 PM         02/02/2014    Type of Therapy:  Group Therapy  Participation Level:  Active  Participation Quality:  Appropriate  Affect:  Appropriate  Cognitive:  Alert and Appropriate  Insight:  Developing/Improving and Engaged  Engagement in Therapy:  Developing/Improving and Engaged  Modes of Intervention:  Discussion, Education, Exploration, Problem-Solving, Rapport Building, Support  Summary of Progress/Problems:  Patient actively participated in group. Patient discussed past and present diagnosis and the effects it has had on  life.  Patient talked about family and society being judgmental and the stigma associated with having a mental health diagnosis.  Patient shared she is learning to accepting her diagnosis and that she is "as good as..."  [name], Ojani Berenson Hairston 02/02/2014
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
## 2014-02-02 NOTE — Progress Notes (Signed)
Adult Psychoeducational Group Note  Date:  02/02/2014 Time:  10:38 PM  Group Topic/Focus:  Wrap-Up Group:   The focus of this group is to help patients review their daily goal of treatment and discuss progress on daily workbooks.  Participation Level:  Active  Participation Quality:  Appropriate, Attentive, Sharing and Supportive  Affect:  Appropriate and Tearful  Cognitive:  Alert, Appropriate and Oriented  Insight: Appropriate and Good  Engagement in Group:  Engaged and Supportive  Modes of Intervention:  Discussion, Education, Socialization and Support  Additional Comments:  Pt attended and participated in group.  Pt shared that she received bad news today but has been working it out by sharing and writing in her journal.  Pt also said that she is happy that she has made others smile today.   Berlin Hunuttle, Jevan Gaunt M 02/02/2014, 10:38 PM

## 2014-02-02 NOTE — Progress Notes (Signed)
Pt presents with flat affect and depressed mood. Pt reported that she slept well. Pt rates decrease depression 4/10 and hopeless 2/10. Pt reported feeling depressed d/t not being able to see her children. Pt making progress today, pt is verbalizing her feelings and issues to staff today. Pt more insightful about treatment. Pt compliant with taking meds and attending groups. Medications administered as ordered per MD. Verbal support given. Pt encouraged to attend groups. 15 minute checks performed for safety. Pt safety maintained at this time.

## 2014-02-02 NOTE — Progress Notes (Signed)
Patient ID: Rebecca Neal, female   DOB: 10/14/1980, 33 y.o.   MRN: [id number] [facility]  02/02/2014 3:07 PM Rebecca Neal  MRN:  [id number] Subjective:  " I am feeling a little better" Objective :   Patient remains depressed but acknoweldges she is feeling better, and today presents with an improved range of affect and is more conversant and animated.  Denies any side effects from current medication regimen. Sleep fair, improved partially on Trazodone at current dose.   Behavior on unit is in good control. No disruptive behaviors. She has been going to groups and seems motivated in treatment. No WDL symptoms . Diagnosis:  MDD , with psychotic features , PTSD   Total Time spent with patient: 20 minutes    ADL's:  Improved   Sleep:  Fair   Appetite: good  Suicidal Ideation:  Denies any current suicidal ideations Homicidal Ideation:  Denies any homicidal ideations AEB (as evidenced by):  Psychiatric Specialty Exam: Physical Exam  Review of Systems  Constitutional: Negative for fever and chills.  Respiratory: Negative for cough and shortness of breath.   Cardiovascular: Negative for chest pain.  Gastrointestinal: Negative for nausea and vomiting.  Genitourinary: Negative for dysuria, urgency and frequency.  Neurological: Negative for dizziness.  Psychiatric/Behavioral: Positive for depression and hallucinations.    Blood pressure 108/83, pulse 105, temperature 98.5 F (36.9 C), temperature source Oral, resp. rate 16, height 5\' 3"  (1.6 m), weight 54.432 kg (120 lb), last menstrual period 01/30/2014.Body mass index is 21.26 kg/(m^2).  General Appearance: Well Groomed  Patent attorneyye Contact::  Good  Speech:  Normal Rate  Volume:  Normal  Mood: Depressed but improved   Affect:  Less constricted , more reactive affect  Thought Process:  Goal Directed  Orientation:  Full (Time, Place, and Person)  Thought Content:  Denies any hallucinations and no delusions are expressed.    Suicidal Thoughts:  No- denies any plan or intention of hurting self at this time  Homicidal Thoughts:  No  Memory:  NA  Judgement:  Fair  Insight:  Fair  Psychomotor Activity:  Normal  Concentration:  Good  Recall:  Good  Fund of Knowledge:Good  Language: Good  Akathisia:  No  Handed:  Right  AIMS (if indicated):     Assets:  Communication Skills Desire for Improvement Resilience  Sleep:  Number of Hours: 2.5   Musculoskeletal: Strength & Muscle Tone: within normal limits Gait & Station: normal Patient leans: N/A  Current Medications: Current Facility-Administered Medications  Medication Dose Route Frequency Provider Last Rate Last Dose  . acetaminophen (TYLENOL) tablet 650 mg  650 mg Oral Q6H PRN Rachael FeeIrving A Lugo, MD   650 mg at 02/02/14 1204  . alum & mag hydroxide-simeth (MAALOX/MYLANTA) 200-200-20 MG/5ML suspension 30 mL  30 mL Oral Q4H PRN Rachael FeeIrving A Lugo, MD      . ARIPiprazole (ABILIFY) tablet 2 mg  2 mg Oral Daily Rachael FeeIrving A Lugo, MD   2 mg at 02/02/14 0804  . FLUoxetine (PROZAC) capsule 20 mg  20 mg Oral Daily Nehemiah MassedFernando Athenia Rys, MD   20 mg at 02/02/14 0804  . hydrOXYzine (ATARAX/VISTARIL) tablet 50 mg  50 mg Oral Q6H PRN Verne SpurrNeil Mashburn, PA-C   50 mg at 01/30/14 1840  . magnesium hydroxide (MILK OF MAGNESIA) suspension 30 mL  30 mL Oral Daily PRN Rachael FeeIrving A Lugo, MD      . nicotine (NICODERM CQ - dosed in mg/24 hours) patch 21 mg  21 mg Transdermal  [id number] Rachael FeeIrving A Lugo, MD   21 mg at 02/02/14 [id number]  . traZODone (DESYREL) tablet 50 mg  50 mg Oral QHS PRN,MR X 1 Truman Haywardakia S Starkes, FNP   50 mg at 02/02/14 0056    Lab Results: No results found for this or any previous visit (from the past 48 hour(s)).  Physical Findings: AIMS: Facial and Oral Movements Muscles of Facial Expression: None, normal Lips and Perioral Area: None, normal Jaw: None, normal Tongue: None, normal,Extremity Movements Upper (arms, wrists, hands, fingers): None, normal Lower (legs, knees, ankles, toes): None,  normal, Trunk Movements Neck, shoulders, hips: None, normal, Overall Severity Severity of abnormal movements (highest score from questions above): None, normal Incapacitation due to abnormal movements: None, normal Patient's awareness of abnormal movements (rate only patient's report): No Awareness, Dental Status Current problems with teeth and/or dentures?: No Does patient usually wear dentures?: No  CIWA:    COWS:     Assessment: Mood and affect improving . (+) ongoing PTSD symptoms, to include a tendency towards hypervigilance, avoidance and frequent intrusive memories.   No currently suicidal or psychotic. Tolerating Prozac well.  Treatment Plan Summary: Daily contact with patient to assess and evaluate symptoms and progress in treatment Medication management See below  Plan:  Continue to provide support, milieu, group therapy. Continue Prozac at  20 mgrs QDAY Continue Abilify at 2 mgrs QDAY Increase Trazodone to 100 mgrs QHS PRN Insomnia Have reviewed with patient importance of obtaining ongoing medical care and considering obtaining Hep C treatment after discharge.   Medical Decision Making Problem Points:  Established problem, stable/improving (1) and Review of psycho-social stressors (1) Data Points:  Review or order clinical lab tests (1) Review of new medications or change in dosage (2)  I certify that inpatient services furnished can reasonably be expected to improve the patient's condition.   Rebecca Neal 02/02/2014, 3:07 PM

## 2014-02-02 NOTE — Progress Notes (Signed)
Recreation Therapy Notes  Animal-Assisted Activity/Therapy (AAA/T) Program Checklist/Progress Notes Patient Eligibility Criteria Checklist & Daily Group note for Rec Tx Intervention  Date: 07.07.2015 Time: 3:15pm Location: [address] Dayroom    AAA/T Program Assumption of Risk Form signed by Patient/ or Parent Legal Guardian yes  Patient is free of allergies or sever asthma yes  Patient reports no fear of animals yes  Patient reports no history of cruelty to animals yes   Patient understands his/her participation is voluntary yes  Patient washes hands before animal contact yes  Patient washes hands after animal contact yes  Behavioral Response: Appropriate  Education: Hand Washing, Appropriate Animal Interaction   Education Outcome: Acknowledges understanding   Clinical Observations/Feedback: Patient interacted appropriately with therapeutic dog team.   Moussa Wiegand L Ninetta Adelstein, LRT/CTRS        Makhari Dovidio L 02/02/2014 4:08 PM 

## 2014-02-02 NOTE — [facility] Suicide Risk Assessment (Signed)
[facility] INPATIENT:  Family/Significant Other Suicide Prevention Education  Suicide Prevention Education:  Education Completed; Rebecca Neal, Boyfriend, [phone]; has been identified by the patient as the family member/significant other with whom the patient will be residing, and identified as the person(s) who will aid the patient in the event of a mental health crisis (suicidal ideations/suicide attempt).  With written consent from the patient, the family member/significant other has been provided the following suicide prevention education, prior to the and/or following the discharge of the patient.  The suicide prevention education provided includes the following:  Suicide risk factors  Suicide prevention and interventions  National Suicide Hotline telephone number  [facility] assessment telephone number  [facility] Emergency Assistance 911  [facility] and/or Residential Mobile Crisis Unit telephone number  Request made of family/significant other to:  Remove weapons (e.g., guns, rifles, knives), all items previously/currently identified as safety concern.  Boyfriend advised patient does not have access to weapons.   Remove drugs/medications (over-the-counter, prescriptions, illicit drugs), all items previously/currently identified as a safety concern.  The family member/significant other verbalizes understanding of the suicide prevention education information provided.  The family member/significant other agrees to remove the items of safety concern listed above.  [name], Lateia Fraser Hairston 02/02/2014, 8:57 AM

## 2014-02-03 NOTE — Progress Notes (Signed)
Patient ID: Rebecca EconomyJennifer Neal, female   DOB: 02/14/1981, 33 y.o.   MRN: [id number] [facility]  02/03/2014 11:03 AM Rebecca EconomyJennifer Neal  MRN:  [id number] Subjective:   Starting to feel better and " more hopeful". Less concerned about returning to work and states she is starting to look forward to discharge soon. Objective :  Patient continues to improve - mood is improved, affect is fuller in range . PTSD symptoms, such as hypervigilance, nightmares, memories, have also subsided significantly at this time She haas been participating in groups and is visible in milieu. No disruptive behaviors on unit  She is tolerating medications well and feels they are helping. ( Prozac and Abilify)  We also discussed the importance of obtaining PCP care after discharge, and to discuss with MD options for Hept C treatment, which thus far has been untreated.  Diagnosis:  MDD , with psychotic features , PTSD   Total Time spent with patient: 20 minutes    ADL's:  Improved   Sleep:  Fair   Appetite: good  Suicidal Ideation:  Denies any current suicidal ideations Homicidal Ideation:  Denies any homicidal ideations AEB (as evidenced by):  Psychiatric Specialty Exam: Physical Exam  Review of Systems  Constitutional: Negative for fever and chills.  Respiratory: Negative for cough and shortness of breath.   Cardiovascular: Negative for chest pain.  Gastrointestinal: Negative for nausea and vomiting.  Genitourinary: Negative for dysuria, urgency and frequency.  Neurological: Negative for dizziness.  Psychiatric/Behavioral: Positive for depression and hallucinations.    Blood pressure 102/67, pulse 93, temperature 98 F (36.7 C), temperature source Oral, resp. rate 18, height 5\' 3"  (1.6 m), weight 54.432 kg (120 lb), last menstrual period 01/30/2014.Body mass index is 21.26 kg/(m^2).  General Appearance: Well Groomed  Patent attorneyye Contact::  Good  Speech:  Normal Rate  Volume:  Normal  Mood: Improving    Affect:  Less constricted , more reactive affect  Thought Process:  Goal Directed  Orientation:  Full (Time, Place, and Person)  Thought Content:  Denies any hallucinations and no delusions are expressed. Less ruminative   Suicidal Thoughts:  No- denies any plan or intention of hurting self at this time  Homicidal Thoughts:  No  Memory:  NA  Judgement:  Fair  Insight:  Fair  Psychomotor Activity:  Normal  Concentration:  Good  Recall:  Good  Fund of Knowledge:Good  Language: Good  Akathisia:  No  Handed:  Right  AIMS (if indicated):     Assets:  Communication Skills Desire for Improvement Resilience  Sleep:  Number of Hours: 4.5   Musculoskeletal: Strength & Muscle Tone: within normal limits Gait & Station: normal Patient leans: N/A  Current Medications: Current Facility-Administered Medications  Medication Dose Route Frequency Provider Last Rate Last Dose  . acetaminophen (TYLENOL) tablet 650 mg  650 mg Oral Q6H PRN Rachael FeeIrving A Lugo, MD   650 mg at 02/02/14 1204  . alum & mag hydroxide-simeth (MAALOX/MYLANTA) 200-200-20 MG/5ML suspension 30 mL  30 mL Oral Q4H PRN Rachael FeeIrving A Lugo, MD      . ARIPiprazole (ABILIFY) tablet 2 mg  2 mg Oral Daily Rachael FeeIrving A Lugo, MD   2 mg at 02/03/14 0747  . FLUoxetine (PROZAC) capsule 20 mg  20 mg Oral Daily Nehemiah MassedFernando Geralda Baumgardner, MD   20 mg at 02/03/14 0747  . hydrOXYzine (ATARAX/VISTARIL) tablet 50 mg  50 mg Oral Q6H PRN Verne SpurrNeil Mashburn, PA-C   50 mg at 02/02/14 2228  . magnesium hydroxide (  MILK OF MAGNESIA) suspension 30 mL  30 mL Oral Daily PRN Rachael FeeIrving A Lugo, MD      . nicotine (NICODERM CQ - dosed in mg/24 hours) patch 21 mg  21 mg Transdermal Q0600 Rachael FeeIrving A Lugo, MD   21 mg at 02/03/14 0800  . traZODone (DESYREL) tablet 100 mg  100 mg Oral QHS PRN Nehemiah MassedFernando Kalyan Barabas, MD   100 mg at 02/02/14 2229    Lab Results: No results found for this or any previous visit (from the past 48 hour(s)).  Physical Findings: AIMS: Facial and Oral Movements Muscles of  Facial Expression: None, normal Lips and Perioral Area: None, normal Jaw: None, normal Tongue: None, normal,Extremity Movements Upper (arms, wrists, hands, fingers): None, normal Lower (legs, knees, ankles, toes): None, normal, Trunk Movements Neck, shoulders, hips: None, normal, Overall Severity Severity of abnormal movements (highest score from questions above): None, normal Incapacitation due to abnormal movements: None, normal Patient's awareness of abnormal movements (rate only patient's report): No Awareness, Dental Status Current problems with teeth and/or dentures?: No Does patient usually wear dentures?: No  CIWA:    COWS:     Assessment: Continues to gradually improve and reports feeling more optimistic and future oriented. Tolerating Prozac/Abilify  well.  Treatment Plan Summary: Daily contact with patient to assess and evaluate symptoms and progress in treatment Medication management See below  Plan:  Continue to provide support, milieu, group therapy. Continue Prozac at  20 mgrs QDAY Continue Abilify at 2 mgrs QDAY Continue Trazodone to 100 mgrs QHS PRN Insomnia Consider Discharge soon as she continues to stabilize    Medical Decision Making Problem Points:  Established problem, stable/improving (1) and Review of psycho-social stressors (1) Data Points:  Review or order clinical lab tests (1) Review of new medications or change in dosage (2)  I certify that inpatient services furnished can reasonably be expected to improve the patient's condition.   Clete Kuch 02/03/2014, 11:03 AM

## 2014-02-03 NOTE — Progress Notes (Signed)
Pt complained of anxiety and depression.  Pt was given prn Vistaril for anxiety and was asleep on reassessment.  Pt asked staff for her phone out of her locker, but it was explained to her once items are placed in the lockers they are not allowed to retrieve these items until discharge.  Pt stated she understood.  Pt has been pleasant and cooperative.  Pt taking medications as prescribed.  Pt denies SI/HI/AVH.  Support and encouragement given.  Pt receptive.

## 2014-02-03 NOTE — Progress Notes (Signed)
Pt presents anxious this morning. Pt reported that her energy level is high. Pt reported decreased depression today. Pt rates depression 2/10 and hopeless 1/10. Pt denies SI/HI/AVH. Pt compliant with taking meds and attending groups. Pt reported that she slept well last night. No complaints verbalized by pt today. Medications administered as ordered per MD. Verbal support given. Pt encouraged to attend groups. 15 minute checks performed for safety.

## 2014-02-03 NOTE — [facility] Group Notes (Signed)
[facility]  Emotional Regulation 1:15 - 2: 30 PM        02/03/2014     Type of Therapy:  Group Therapy  Participation Level: Did not attend group   Joas Motton, Joesph JulyQuylle Hairston 02/03/2014

## 2014-02-03 NOTE — [facility] Group Notes (Signed)
[facility]   02/03/2014 11:54 AM    Participation Quality:  Appropraite  Mood/Affect:  Appropriate  Depression Rating:  1  Anxiety Rating:  1  Thoughts of Suicide:  No  Will you contract for safety?   NA  Current AVH:  No  Plan for Discharge/Comments:  Patient attended discharge planning group and actively participated in group.  She reports being much better and [facility]. CSW provided all participants with daily workbook.   Transportation Means: Patient has transportation.   Supports:  Patient has a support system.   Loxley Schmale, Joesph JulyQuylle Hairston

## 2014-02-04 DIAGNOSIS — F332 Major depressive disorder, recurrent severe without psychotic features: Principal | ICD-10-CM

## 2014-02-04 MED ORDER — ARIPIPRAZOLE 2 MG PO TABS: 2.0000 mg | ORAL_TABLET | Freq: Every day | ORAL | Status: AC

## 2014-02-04 MED ORDER — NICOTINE 21 MG/24HR TD PT24: 21.0000 mg | MEDICATED_PATCH | Freq: Every day | TRANSDERMAL | Status: AC

## 2014-02-04 MED ORDER — TRAZODONE HCL 100 MG PO TABS: 100.0000 mg | ORAL_TABLET | Freq: Every evening | ORAL | Status: AC | PRN

## 2014-02-04 MED ORDER — FLUOXETINE HCL 20 MG PO CAPS: 20.0000 mg | ORAL_CAPSULE | Freq: Every day | ORAL | Status: AC

## 2014-02-04 NOTE — Progress Notes (Signed)
Recreation Therapy Notes  Animal-Assisted Activity/Therapy (AAA/T) Program Checklist/Progress Notes Patient Eligibility Criteria Checklist & Daily Group note for Rec Tx Intervention  Date: 07.09.2015 Time: 3:15pm [facility]   AAA/T Program Assumption of Risk Form signed by Patient/ or Parent Legal Guardian yes  Patient is free of allergies or sever asthma yes  Patient reports no fear of animals yes  Patient reports no history of cruelty to animals yes   Patient understands his/her participation is voluntary yes  Patient washes hands before animal contact yes  Patient washes hands after animal contact yes  Behavioral Response: Engaged, Appropriate   Education: Hand Washing, Appropriate Animal Interaction   Education Outcome: Acknowledges understanding   Clinical Observations/Feedback: Patient interacted appropriately with therapy dog and peers in session.    Marykay Lexenise L Bernice Mullin, LRT/CTRS  Tanisha Lutes L 02/04/2014 4:08 PM

## 2014-02-04 NOTE — Progress Notes (Signed)
D: Pt in bed resting with eyes closed. Respirations even and unlabored. Pt appears to be in no signs of distress at this time. A: Q15min checks remains for this pt. R: Pt remains safe at this time.   

## 2014-02-04 NOTE — Progress Notes (Signed)
[facility] :  Will you be returning to the same living situation after discharge: Yes,  [place], boyfriend is supportive At discharge, do you have transportation home?:Yes,  boyfriend will pick pt up Do you have the ability to pay for your medications:Yes,  provided pt with samples and prescriptions and [facility]  Release of information consent forms completed and in the chart;  Patient's signature needed at discharge.  Patient to Follow up at: Follow-up Information   [facility]. (Please go to [facility]'s walk in clinic on Thursday, February 04, 2014 or any weekday between 8AM - 12Noon or 1-3PM for medication management/counseling)    Contact information:   [address] [place], [place]   [id number]  [phone]      Patient denies SI/HI:   Yes,  denies SI/HI    Safety Planning and Suicide Prevention discussed:  Yes,  discussed with pt and pt's boyfriend.  See suicide prevention education note.    Rebecca MillerHorton, Rebecca Neal 02/04/2014, 10:09 AM
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
## 2014-02-04 NOTE — Progress Notes (Signed)
Patient ID: Rebecca EconomyJennifer Capers, female   DOB: 07/31/1980, 33 y.o.   MRN: [id number] Pt discharged at this time. Pt denies SI/HI. Pt provided with prescriptions and sample medications. Pt given discharge instructions and pt verbalized understanding, all belongings were returned.

## 2014-02-04 NOTE — Progress Notes (Signed)
Adult Psychoeducational Group Note  Date:  02/04/2014 Time:  10:00am Group Topic/Focus:  Personal Choices and Values:   The focus of this group is to help patients assess and explore the importance of values in their lives, how their values affect their decisions, how they express their values and what opposes their expression.  Participation Level:  Active  Participation Quality:  Appropriate and Attentive  Affect:  Appropriate  Cognitive:  Alert and Appropriate  Insight: Appropriate  Engagement in Group:  Engaged  Modes of Intervention:  Discussion and Education  Additional Comments:  Pt attended and participated in group. Group activity was therapeutic ball. Pt was to answer a question on the ball. Pt answered If you had a million dollars what would you do with it? Pt stated Have her mouth worked on and buy her a house.  Shelly BombardGarner, Taunya Goral D 02/04/2014, 11:21 AM

## 2014-02-04 NOTE — [facility] Group Notes (Signed)
[facility] LCSW Group Therapy  02/04/2014   1:15 PM   Type of Therapy:  Group Therapy  Participation Level:  Active  Participation Quality:  Attentive, Sharing and Supportive  Affect:  Calm  Cognitive:  Alert and Oriented  Insight:  Developing/Improving and Engaged  Engagement in Therapy:  Developing/Improving and Engaged  Modes of Intervention:  Activity, Clarification, Confrontation, Discussion, Education, Exploration, Limit-setting, Orientation, Problem-solving, Rapport Building, Reality Testing, Socialization and Support  Summary of Progress/Problems: Patient was attentive and engaged with speaker from [employer].  Patient was attentive to speaker while they shared their story of dealing with mental health and overcoming it.  Patient expressed interest in their programs and services and received information on their agency.  Patient processed ways they can relate to the speaker.     Ledell Codrington Horton, LCSW 02/04/2014  1:43 PM     

## 2014-02-04 NOTE — [facility] Suicide Risk Assessment (Signed)
   Demographic Factors:  33 year old female, employed, currently living with boyfriend  Total Time spent with patient: 30 minutes  Psychiatric Specialty Exam: Physical Exam  ROS  Blood pressure 102/67, pulse 93, temperature 98 F (36.7 C), temperature source Oral, resp. rate 18, height 5\' 3"  (1.6 m), weight 54.432 kg (120 lb), last menstrual period 01/30/2014.Body mass index is 21.26 kg/(m^2).  General Appearance: Well Groomed  Patent attorneyye Contact::  Good  Speech:  Normal Rate  Volume:  Normal  Mood:  Euthymic  Affect:  Congruent and Full Range  Thought Process:  Linear  Orientation:  Full (Time, Place, and Person)  Thought Content:  denies any psychotic symptoms- no hallucinations, no delusions  Suicidal Thoughts:  No- denies any suicidal ideations  Homicidal Thoughts:  No  Memory:  NA  Judgement:  Good  Insight:  Fair  Psychomotor Activity:  Normal  Concentration:  Good  Recall:  Good  Fund of Knowledge:Good  Language: Good  Akathisia:  No  Handed:  Right  AIMS (if indicated):     Assets:  Communication Skills Desire for Improvement Resilience Social Support  Sleep:  Number of Hours: 4.5    Musculoskeletal: Strength & Muscle Tone: within normal limits Gait & Station: normal Patient leans: N/A   Mental Status Per Nursing Assessment::   On Admission:  Suicidal ideation indicated by patient  Current Mental Status by Physician: At this time patient calm, pleasant, well related,  not suicidal or homicidal or psychotic, future oriented  Loss Factors: not having custody of children  Historical Factors: Prior suicide attempts  Risk Reduction Factors:   Responsible for children under 33 years of age, Employed, Living with another person, especially a relative, Positive social support and Positive coping skills or problem solving skills  Continued Clinical Symptoms:  At this time depression much improved- currently euthymic   Cognitive Features That Contribute To Risk:   No gross cognitive deficits noted   Suicide Risk:  Mild:  Suicidal ideation of limited frequency, intensity, duration, and specificity.  There are no identifiable plans, no associated intent, mild dysphoria and related symptoms, good self-control (both objective and subjective assessment), few other risk factors, and identifiable protective factors, including available and accessible social support.  Discharge Diagnoses:   AXIS I:  Major Depression, Recurrent severe AXIS II:  Deferred AXIS III:   Past Medical History  Diagnosis Date  . Hepatitis C    AXIS IV:  other psychosocial or environmental problems AXIS V:  61-70 mild symptoms ( 65 at this time )   Plan Of Care/Follow-up recommendations:  Activity:  as tolerated  Diet:  Regular Diet Tests:  NA Other:  See below  Is patient on multiple antipsychotic therapies at discharge:  No   Has Patient had three or more failed trials of antipsychotic monotherapy by history:  No  Recommended Plan for Multiple Antipsychotic Therapies: NA  Patient in good spirits at time of discharge. She is going back home- lives with boyfriend. Plans to return to work as of next week at the latest. [facility]. Encouraged her to follow up for medical management/monitoring/ treatment of Hep C status- referred to [facility].   [place], Kayler Buckholtz 02/04/2014, 8:21 AM

## 2014-02-08 NOTE — Progress Notes (Signed)
Patient Discharge Instructions:  After Visit Summary (AVS):   Faxed to:  02/08/14 Discharge Summary Note:   Faxed to:  02/08/14 Psychiatric Admission Assessment Note:   Faxed to:  02/08/14 Suicide Risk Assessment - Discharge Assessment:   Faxed to:  02/08/14 Faxed/Sent to the Next Level Care provider:  02/08/14 [facility] @ [phone]  Jerelene ReddenSheena E St. John, 02/08/2014, 4:02 PM

## 2014-02-10 NOTE — Discharge Summary (Signed)
Patient seen, Suicide Assessment Completed.  Disposition Plan Reviewed  

## 2015-01-24 ENCOUNTER — Other Ambulatory Visit: Payer: Self-pay

## 2015-01-28 ENCOUNTER — Telehealth (HOSPITAL_COMMUNITY): Payer: Self-pay | Admitting: *Deleted

## 2015-01-28 NOTE — Telephone Encounter (Signed)
Telephoned patient at home # and left message to return call to [facility] 

## 2015-03-03 ENCOUNTER — Emergency Department (HOSPITAL_COMMUNITY): Payer: Self-pay

## 2015-03-03 ENCOUNTER — Encounter (HOSPITAL_COMMUNITY): Payer: Self-pay | Admitting: *Deleted

## 2015-03-03 ENCOUNTER — Emergency Department (HOSPITAL_COMMUNITY)
Admission: EM | Admit: 2015-03-03 | Discharge: 2015-03-03 | Disposition: A | Discharge status: Home / Self Care | Payer: Self-pay | Attending: Emergency Medicine | Admitting: Emergency Medicine

## 2015-03-03 DIAGNOSIS — R110 Nausea: Secondary | ICD-10-CM | POA: Insufficient documentation

## 2015-03-03 DIAGNOSIS — Z720 Tobacco use: Secondary | ICD-10-CM | POA: Insufficient documentation

## 2015-03-03 DIAGNOSIS — Z3202 Encounter for pregnancy test, result negative: Secondary | ICD-10-CM | POA: Insufficient documentation

## 2015-03-03 DIAGNOSIS — R197 Diarrhea, unspecified: Secondary | ICD-10-CM | POA: Insufficient documentation

## 2015-03-03 DIAGNOSIS — R1011 Right upper quadrant pain: Secondary | ICD-10-CM | POA: Insufficient documentation

## 2015-03-03 DIAGNOSIS — M549 Dorsalgia, unspecified: Secondary | ICD-10-CM | POA: Insufficient documentation

## 2015-03-03 DIAGNOSIS — Z8619 Personal history of other infectious and parasitic diseases: Secondary | ICD-10-CM | POA: Insufficient documentation

## 2015-03-03 DIAGNOSIS — R079 Chest pain, unspecified: Secondary | ICD-10-CM | POA: Insufficient documentation

## 2015-03-03 DIAGNOSIS — Z79899 Other long term (current) drug therapy: Secondary | ICD-10-CM | POA: Insufficient documentation

## 2015-03-03 LAB — COMPREHENSIVE METABOLIC PANEL
ALBUMIN: 3.8 g/dL (ref 3.5–5.0)
ALK PHOS: 72 U/L (ref 38–126)
ALT: 24 U/L (ref 14–54)
AST: 20 U/L (ref 15–41)
Anion gap: 6 (ref 5–15)
BILIRUBIN TOTAL: 0.2 mg/dL — AB (ref 0.3–1.2)
BUN: 7 mg/dL (ref 6–20)
CO2: 24 mmol/L (ref 22–32)
Calcium: 9.4 mg/dL (ref 8.9–10.3)
Chloride: 110 mmol/L (ref 101–111)
Creatinine, Ser: 0.61 mg/dL (ref 0.44–1.00)
GFR calc Af Amer: >60 mL/min (ref >60)
GFR calc non Af Amer: >60 mL/min (ref >60)
Glucose, Bld: 86 mg/dL (ref 65–99)
POTASSIUM: 4.4 mmol/L (ref 3.5–5.1)
Sodium: 140 mmol/L (ref 135–145)
TOTAL PROTEIN: 6.7 g/dL (ref 6.5–8.1)

## 2015-03-03 LAB — CBC
HEMATOCRIT: 38.2 % (ref 36.0–46.0)
HEMOGLOBIN: 13.1 g/dL (ref 12.0–15.0)
MCH: 31.4 pg (ref 26.0–34.0)
MCHC: 34.3 g/dL (ref 30.0–36.0)
MCV: 91.6 fL (ref 78.0–100.0)
Platelets: 203 10*3/uL (ref 150–400)
RBC: 4.17 MIL/uL (ref 3.87–5.11)
RDW: 13.4 % (ref 11.5–15.5)
WBC: 5.9 10*3/uL (ref 4.0–10.5)

## 2015-03-03 LAB — I-STAT TROPONIN, ED: Troponin i, poc: 0 ng/mL (ref 0.00–0.08)

## 2015-03-03 LAB — URINALYSIS, ROUTINE W REFLEX MICROSCOPIC
Bilirubin Urine: NEGATIVE
Glucose, UA: NEGATIVE mg/dL
Hgb urine dipstick: NEGATIVE
Ketones, ur: NEGATIVE mg/dL
Leukocytes, UA: NEGATIVE
Nitrite: NEGATIVE
Protein, ur: NEGATIVE mg/dL
SPECIFIC GRAVITY, URINE: 1.01 (ref 1.005–1.030)
UROBILINOGEN UA: 1 mg/dL (ref 0.0–1.0)
pH: 6 (ref 5.0–8.0)

## 2015-03-03 LAB — PREGNANCY, URINE: Preg Test, Ur: NEGATIVE

## 2015-03-03 LAB — LIPASE, BLOOD: Lipase: 30 U/L (ref 22–51)

## 2015-03-03 MED ORDER — ONDANSETRON HCL 4 MG/2ML IJ SOLN
4.0000 mg | Freq: Once | INTRAMUSCULAR | Status: AC
Start: 2015-03-03 — End: 2015-03-03
  Administered 2015-03-03: 4 mg via INTRAVENOUS
  Filled 2015-03-03: qty 2

## 2015-03-03 NOTE — ED Notes (Signed)
Pt placed in a gown and hooked up to the monitor with there 5 lead, BP cuff and pulse ox

## 2015-03-03 NOTE — ED Notes (Signed)
Pt reports rt sided abdominal pain that radiaes to back as well as intermittent left sided chest pain. Pt reports hx of hep c and states " i just want to get my liver checked"

## 2015-03-03 NOTE — ED Provider Notes (Signed)
CSN: [id number]     Arrival date & time 03/03/15  1212 History   First MD Initiated Contact with Patient 03/03/15 1224     Chief Complaint  Patient presents with  . Abdominal Pain  . Chest Pain     (Consider location/radiation/quality/duration/timing/severity/associated sxs/prior Treatment) HPI  34 year old female with a history of hepatitis C presents with right upper quadrant pressure and pain for the past 2 weeks. The pain is constant. Nothing made him better or worse. Eating does not make it worse. Has had nausea without vomiting. Has had 3 episodes of loose stools over this two-week period. Also is complaining of one week of intermittent chest pain. States it feels like an aching pain on the left side of her chest. She thinks it might be related to anxiety. No shortness of breath. When she is up doing things like work she does not notice it but whenever she comes to arrest she starts to nose the pain. Very mild chest pain at this time.  Past Medical History  Diagnosis Date  . Hepatitis C    Past Surgical History  Procedure Laterality Date  . Hernia repair     No family history on file. History  Substance Use Topics  . Smoking status: Current Every Day Smoker -- 1.00 packs/day for 15 years    Types: Cigarettes  . Smokeless tobacco: Not on file  . Alcohol Use: Yes   OB History    No data available     Review of Systems  Constitutional: Negative for fever.  Respiratory: Negative for shortness of breath.   Cardiovascular: Positive for chest pain.  Gastrointestinal: Positive for nausea, abdominal pain and diarrhea. Negative for vomiting.  Genitourinary: Negative for dysuria.  Musculoskeletal: Positive for back pain.  All other systems reviewed and are negative.     Allergies  Ultram and Z-pak  Home Medications   Prior to Admission medications   Medication Sig Start Date End Date Taking? Authorizing Provider  ARIPiprazole (ABILIFY) 2 MG tablet Take 1 tablet (2 mg  total) by mouth daily. 02/04/14   Thermon Leyland, NP  FLUoxetine (PROZAC) 20 MG capsule Take 1 capsule (20 mg total) by mouth daily. 02/04/14   Thermon Leyland, NP  nicotine (NICODERM CQ - DOSED IN MG/24 HOURS) 21 mg/24hr patch Place 1 patch (21 mg total) onto the skin daily at 6 (six) AM. 02/04/14   Thermon Leyland, NP  traZODone (DESYREL) 100 MG tablet Take 1 tablet (100 mg total) by mouth at bedtime as needed for sleep. 02/04/14   Thermon Leyland, NP   BP 117/69 mmHg  Pulse 60  Temp(Src) 98.2 F (36.8 C) (Oral)  Resp 17  Ht  (1.6 m)  Wt 145 lb (65.772 kg)  BMI 25.69 kg/m2  SpO2 99%  LMP 02/17/2015 Physical Exam  Constitutional: She is oriented to person, place, and time. She appears well-developed and well-nourished.  HENT:  Head: Normocephalic and atraumatic.  Right Ear: External ear normal.  Left Ear: External ear normal.  Nose: Nose normal.  Eyes: Right eye exhibits no discharge. Left eye exhibits no discharge.  Cardiovascular: Normal rate, regular rhythm and normal heart sounds.   Pulmonary/Chest: Effort normal and breath sounds normal. She exhibits no tenderness.  Abdominal: Soft. She exhibits no distension. There is no hepatomegaly. There is no tenderness. There is negative Murphy's sign.  Neurological: She is alert and oriented to person, place, and time.  Skin: Skin is warm and dry.  Nursing  note and vitals reviewed.   ED Course  Procedures (including critical care time) Labs Review Labs Reviewed  COMPREHENSIVE METABOLIC PANEL - Abnormal; Notable for the following:    Total Bilirubin 0.2 (*)    All other components within normal limits  LIPASE, BLOOD  CBC  URINALYSIS, ROUTINE W REFLEX MICROSCOPIC (NOT AT [facility])  PREGNANCY, URINE  I-STAT TROPOININ, ED    Imaging Review Dg Chest 2 View  03/03/2015   CLINICAL DATA:  Left-sided chest pain  EXAM: CHEST  2 VIEW  COMPARISON:  None.  FINDINGS: Lungs are clear. Heart size and pulmonary vascularity are normal. No adenopathy. No  pneumothorax. No bone lesions.  IMPRESSION: No abnormality noted.   Electronically Signed   By: Bretta Bang III M.D.   On: 03/03/2015 13:30   US Abdomen Limited  03/03/2015   CLINICAL DATA:  Right upper quadrant pain for 2 weeks  EXAM: US ABDOMEN LIMITED - RIGHT UPPER QUADRANT  COMPARISON:  CT scan 05/08/2013  FINDINGS: Gallbladder:  No gallstones or wall thickening visualized. No sonographic Murphy sign noted.  Common bile duct:  Diameter: 6 mm in diameter  Liver:  No focal lesion identified. Within normal limits in parenchymal echogenicity.  IMPRESSION: No gallstones are noted within gallbladder. No sonographic Murphy's sign. Mild prominent size CBD measures 6 mm in diameter.   Electronically Signed   By: Natasha Mead M.D.   On: 03/03/2015 14:46     EKG Interpretation None     ED ECG REPORT   Date: 03/03/2015  Rate: 60  Rhythm: normal sinus rhythm  QRS Axis: normal  Intervals: normal  ST/T Wave abnormalities: normal  Conduction Disutrbances:none  Narrative Interpretation:   Old EKG Reviewed: none available  I have personally reviewed the EKG tracing and agree with the computerized printout as noted.  MDM   Final diagnoses:  Right upper quadrant pain  Chest pain, unspecified chest pain type    Patient's exam is unremarkable including no significant right upper quadrant tenderness. Patient's ultrasound is unremarkable in her liver function tests are normal. Unclear exactly why she is having this right upper quadrant pain. Doubt atypical kidney source given no hematuria or UTI. Her chest pain is quite atypical and with a normal EKG, normal troponin, no risk factors, and one week worth of symptoms I do not feel further workup for ACS is indicated. Very unlikely to be dissection and I have extremely low concern for pulmonary embolism. Will refer to her PCP, stable for discharge.    Pricilla Loveless, MD 03/03/15 [phone]

## 2015-03-03 NOTE — ED Notes (Signed)
MD at the bedside  

## 2015-04-07 ENCOUNTER — Telehealth: Payer: Self-pay | Admitting: Lab

## 2015-04-07 NOTE — Telephone Encounter (Signed)
01/24/15-No show for labs-02/15/15-Sent [facility]-voicemail box full-07/29-pt said she had one jocument to take back to [facility] to get orange card-she plan to take it back next week and would call me when she received.  04/06/15-LM -Unless I hear from pt -Referral is in an inactive status-informed PCP's office

## 2015-04-12 NOTE — Telephone Encounter (Signed)
Spoke with patient today-she stated that she had not applied for orange card yet and she was moving to [place]-so I told her that I would contact PCP's office

## 2015-11-06 IMAGING — US US ABDOMEN LIMITED
1 series · 14 of 25 positions shown · non-contrast
Comparison: CT scan 05/08/2013

CLINICAL DATA: Right upper quadrant pain for 2 weeks

EXAM:
US ABDOMEN LIMITED - RIGHT UPPER QUADRANT

[Series 1: us abdomen limited · 0.21mm/px · 14 of 49 slices shown]
[im 1/49]
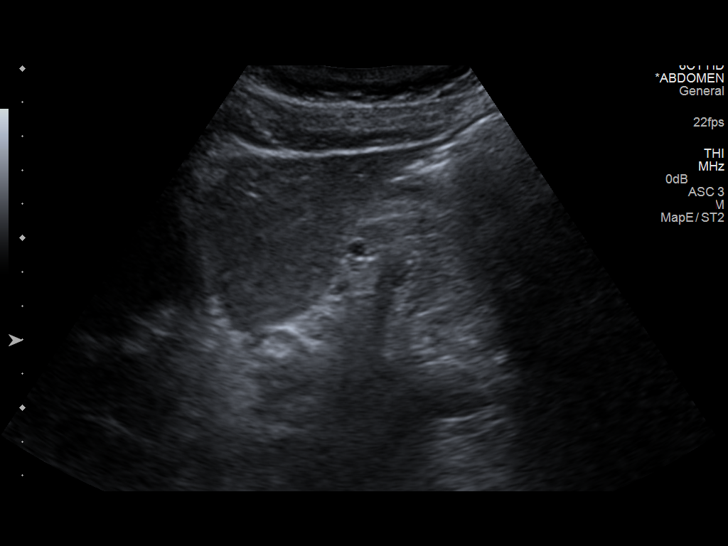
[im 5/49]
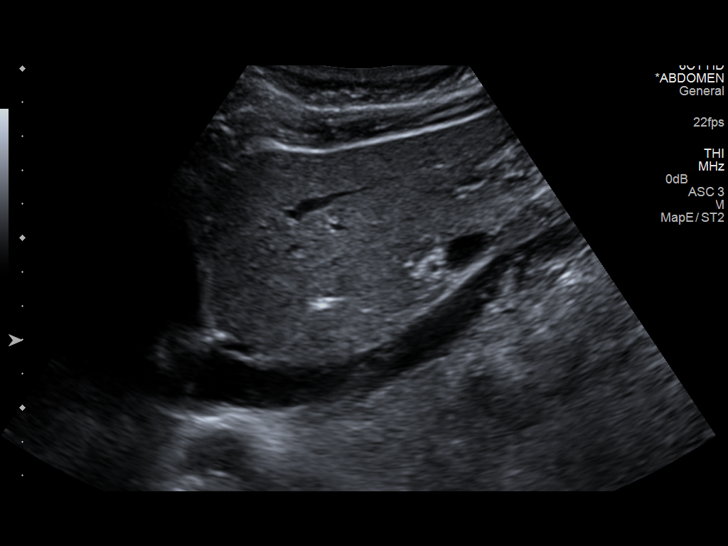
[im 9/49]
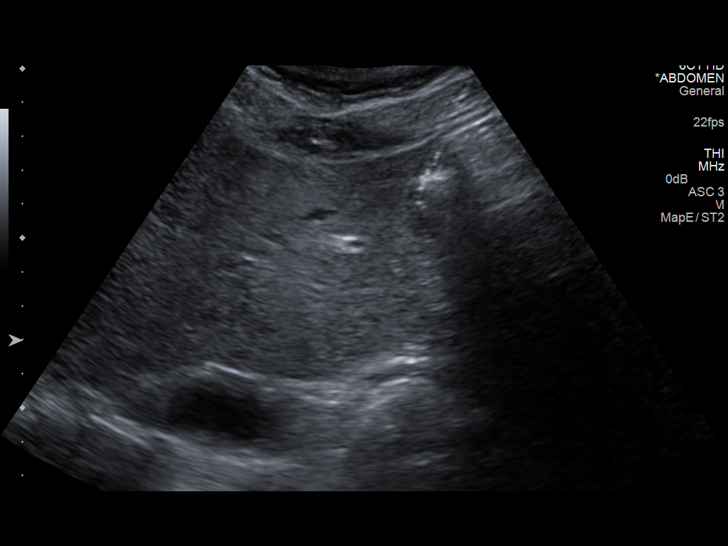
[im 13/49]
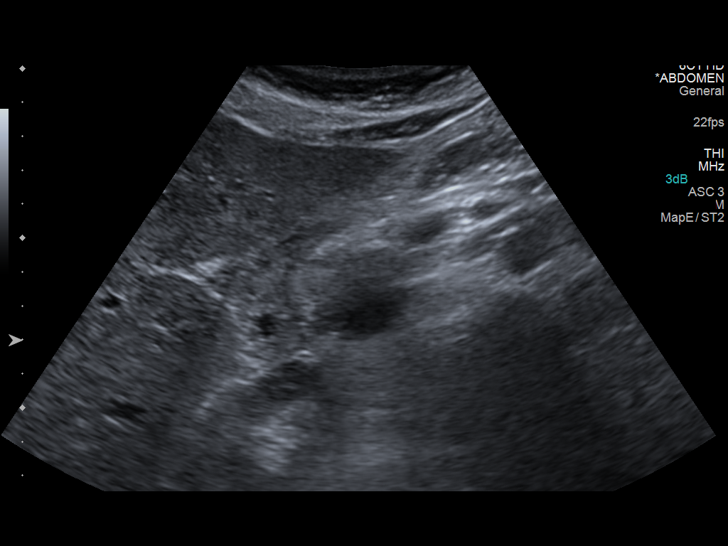
[im 17/49]
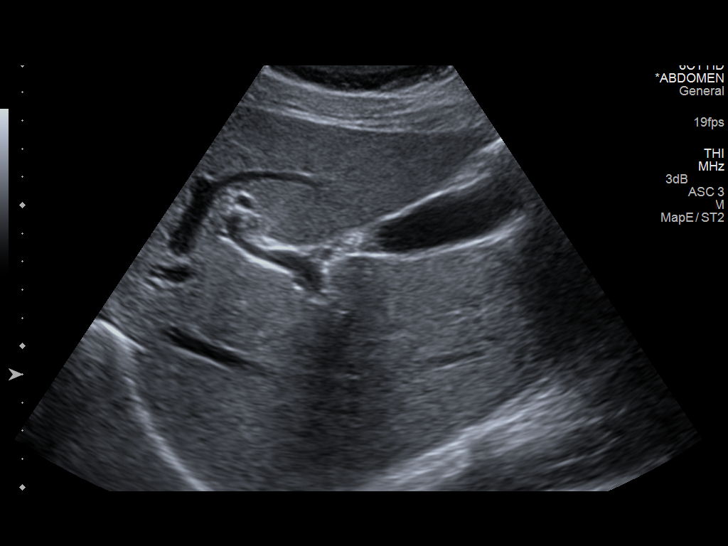
[im 19/49]
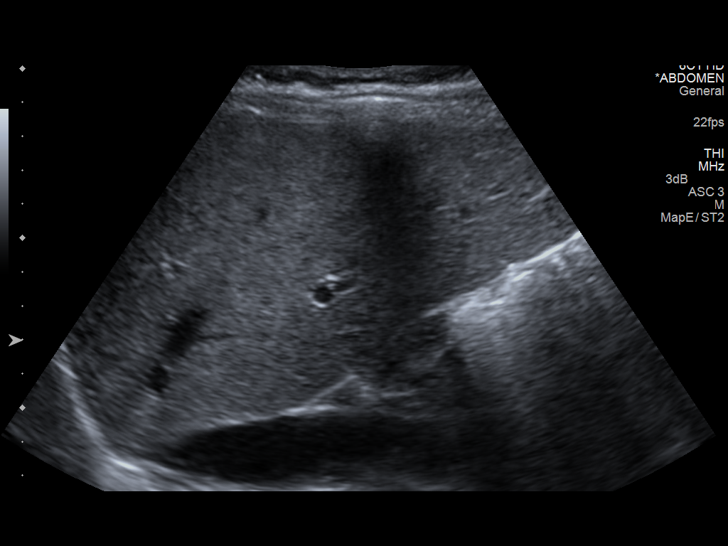
[im 23/49]
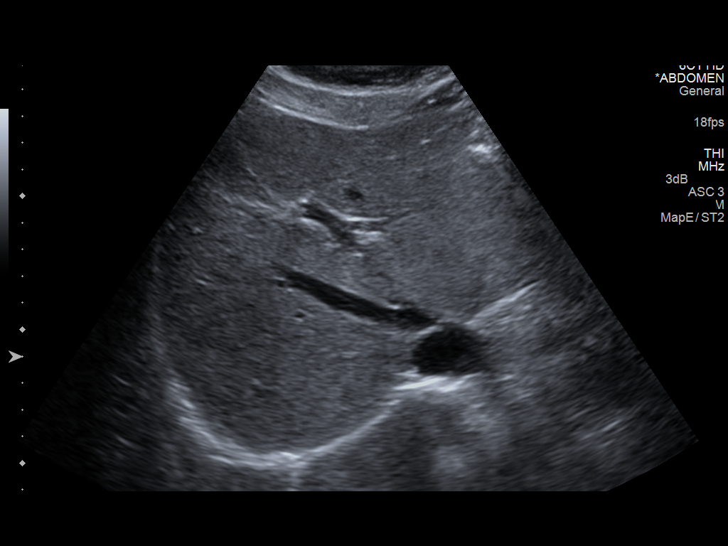
[im 27/49]
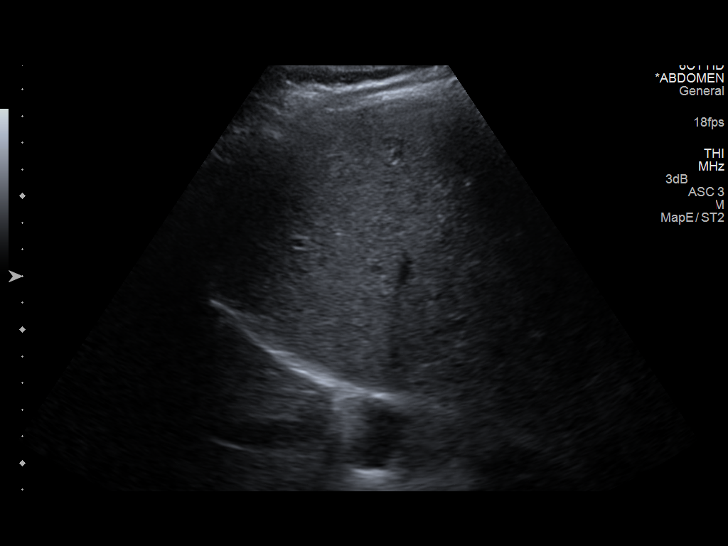
[im 31/49]
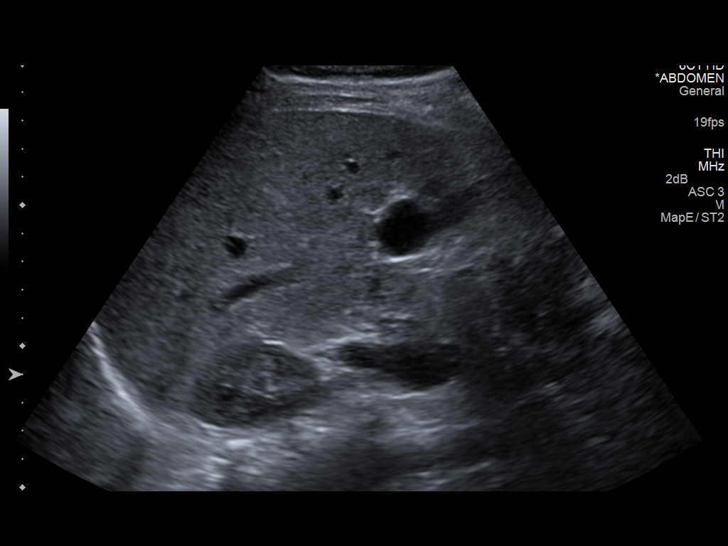
[im 33/49]
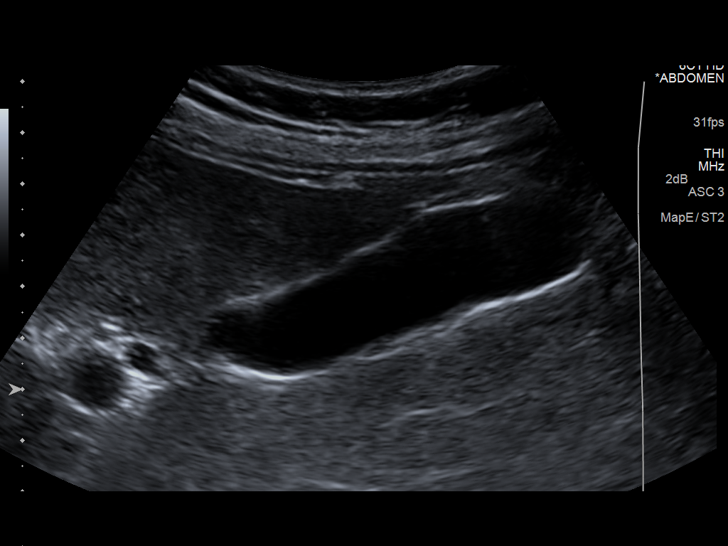
[im 37/49]
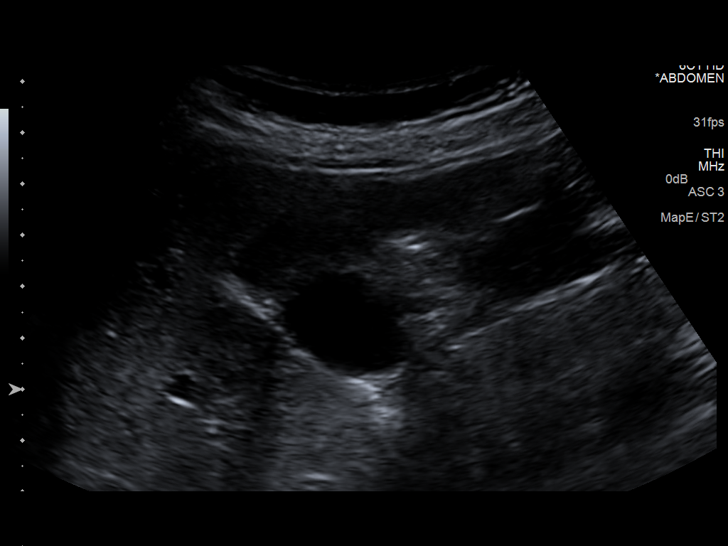
[im 41/49]
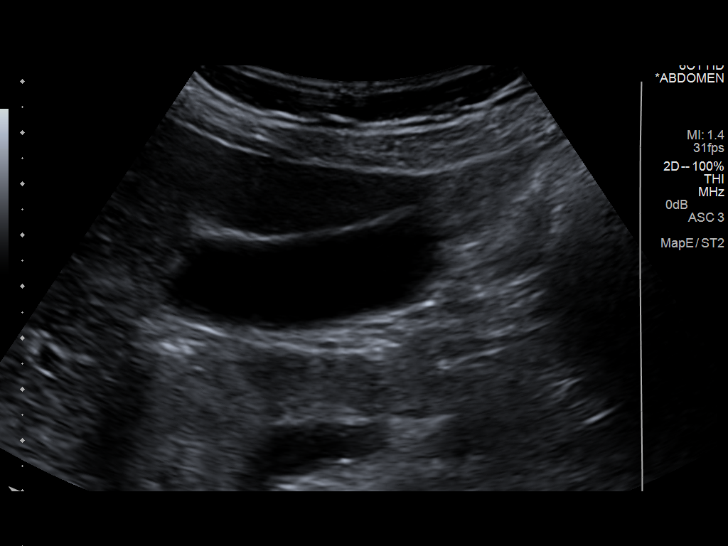
[im 45/49]
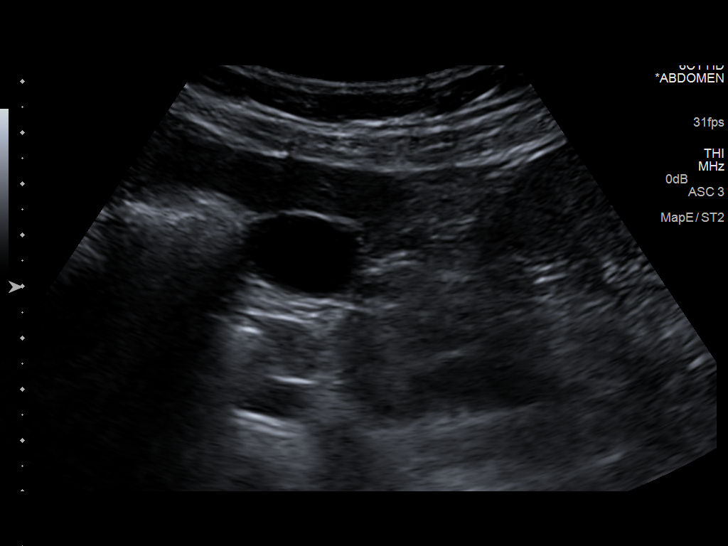
[im 49/49]
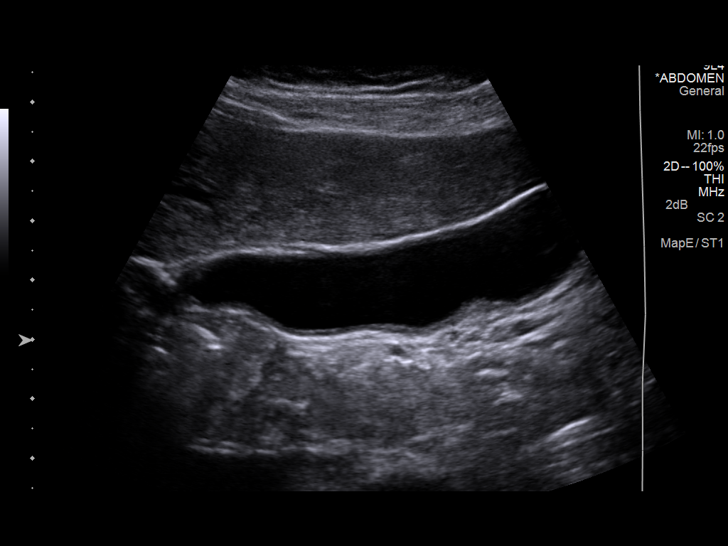

[14 of 25 positions shown; findings below may reference images not displayed]

FINDINGS: Gallbladder:

No gallstones or wall thickening visualized. No sonographic Murphy
sign noted.

Common bile duct:

Diameter: 6 mm in diameter

Liver:

No focal lesion identified. Within normal limits in parenchymal
echogenicity.
IMPRESSION: No gallstones are noted within gallbladder. No sonographic Murphy's
sign. Mild prominent size CBD measures 6 mm in diameter.

## 2016-01-26 IMAGING — DX DG CHEST 2V
2 series · 2 of 2 positions shown · non-contrast
Comparison: None.

CLINICAL DATA: Left-sided chest pain

EXAM:
CHEST  2 VIEW

[w chest pa]
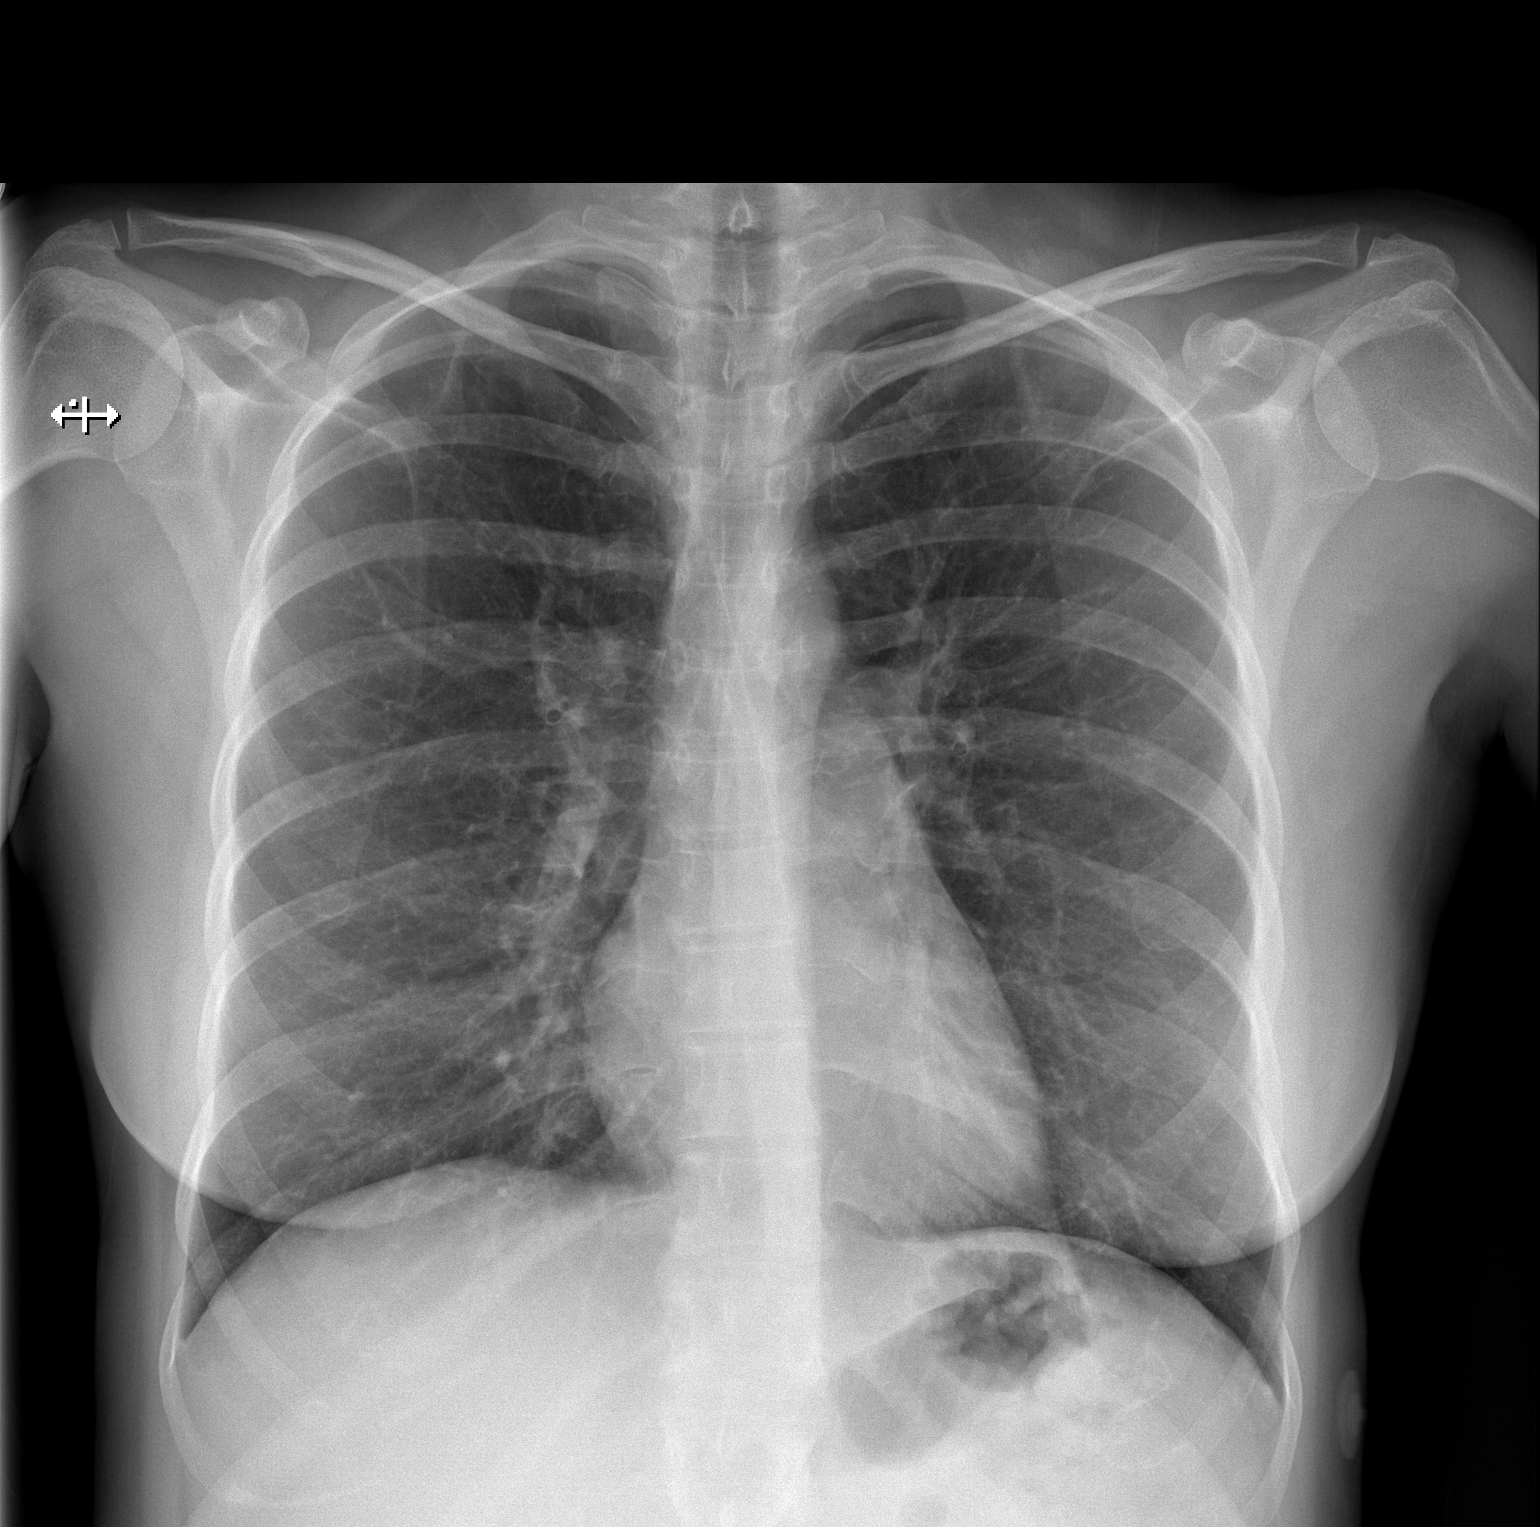

[w chest lat]
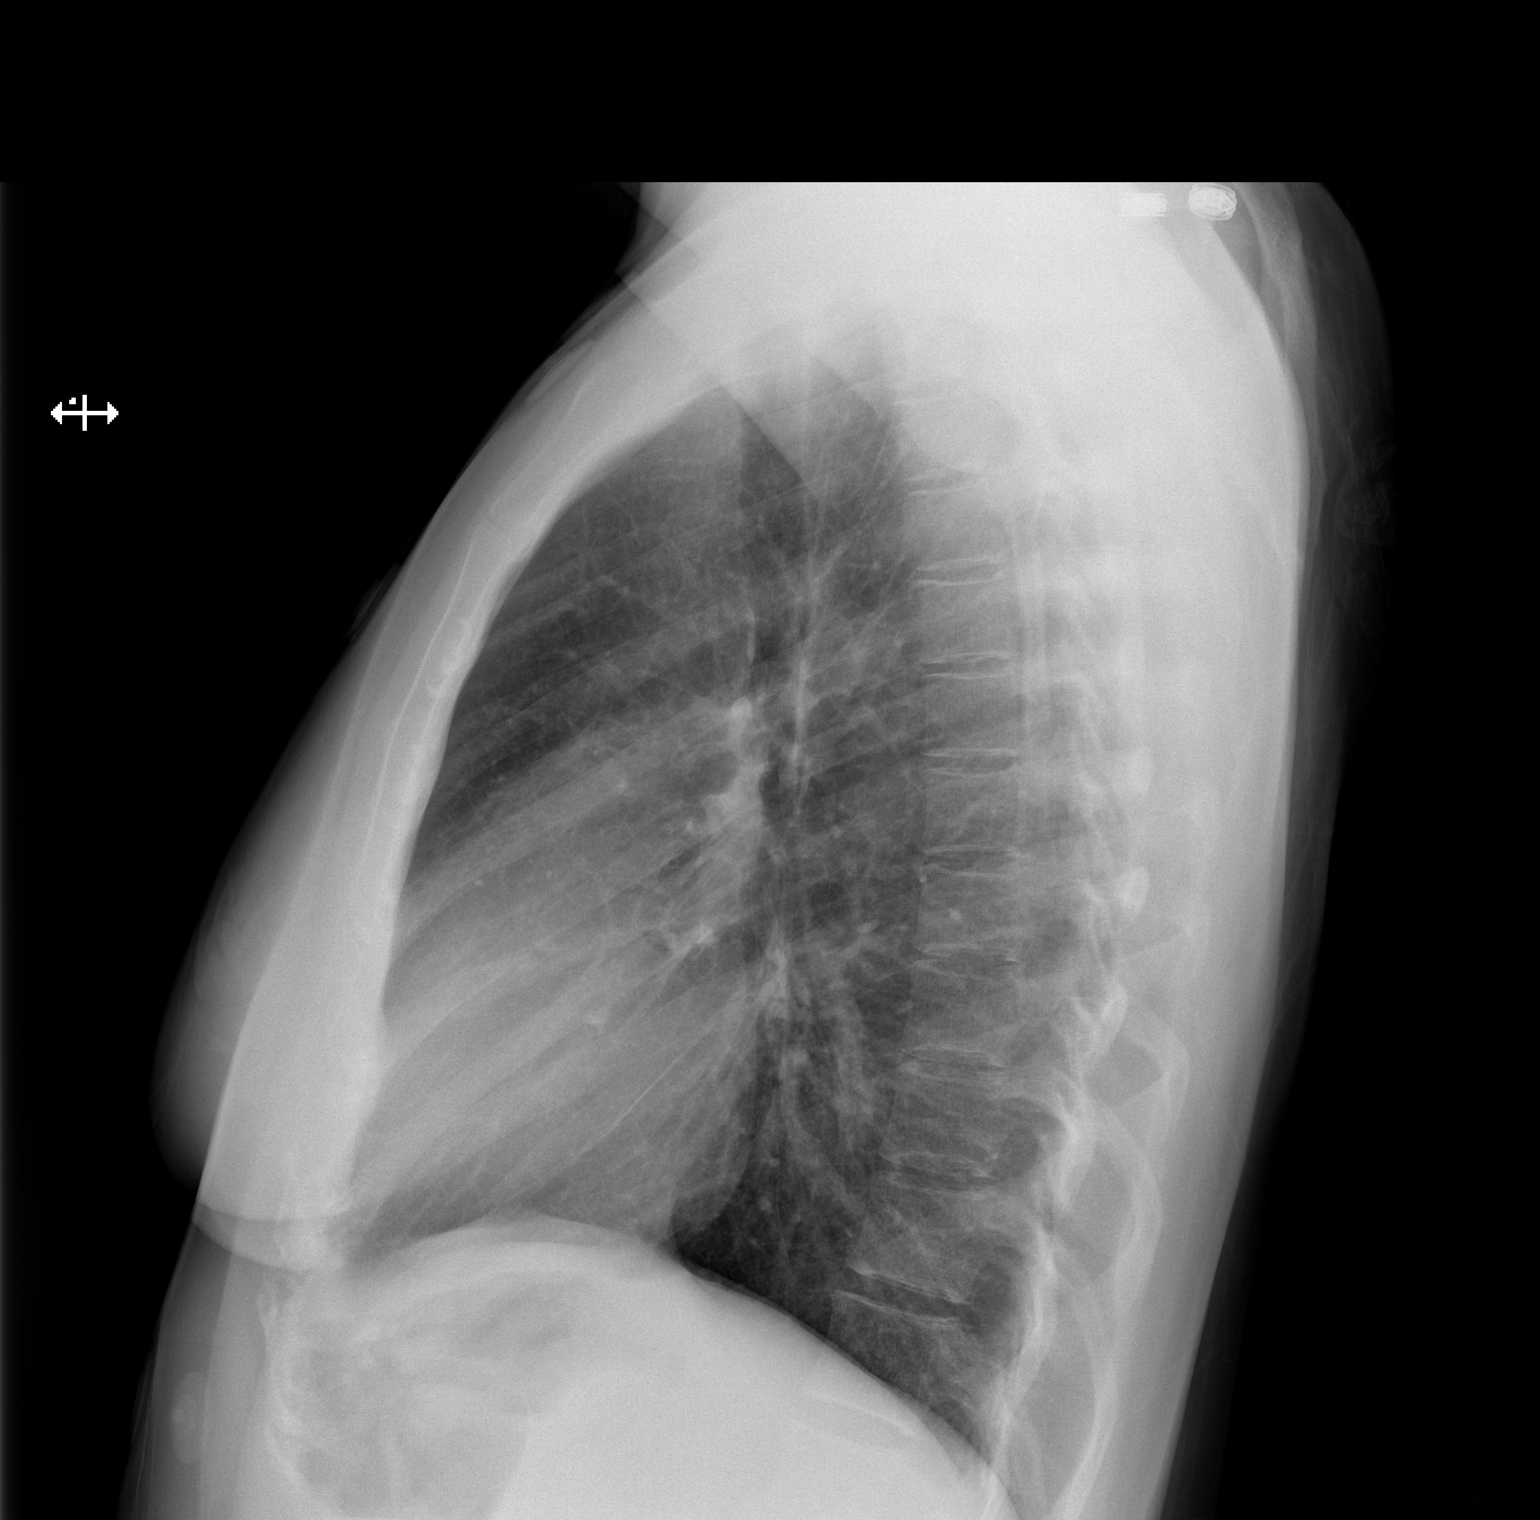

[2 of 2 positions shown; findings below may reference images not displayed]

FINDINGS: Lungs are clear. Heart size and pulmonary vascularity are normal. No
adenopathy. No pneumothorax. No bone lesions.
IMPRESSION: No abnormality noted.
